# Patient Record
Sex: Male | Born: 1950 | Race: Black or African American | Hispanic: No | Marital: Single | Smoking: Current every day smoker
Health system: Southern US, Community
[De-identification: ages and names within clinical notes are randomized; demographics above are authoritative.]

## PROBLEM LIST (undated history)

## (undated) DIAGNOSIS — E669 Obesity, unspecified: Secondary | ICD-10-CM

## (undated) DIAGNOSIS — H409 Unspecified glaucoma: Secondary | ICD-10-CM

## (undated) DIAGNOSIS — E119 Type 2 diabetes mellitus without complications: Secondary | ICD-10-CM

## (undated) DIAGNOSIS — H543 Unqualified visual loss, both eyes: Secondary | ICD-10-CM

## (undated) HISTORY — PX: EYE SURGERY: SHX253

## (undated) HISTORY — PX: TONSILLECTOMY: SUR1361

---

## 2006-03-16 ENCOUNTER — Emergency Department: Payer: Self-pay | Admitting: Emergency Medicine

## 2007-03-30 ENCOUNTER — Ambulatory Visit: Payer: Self-pay | Admitting: Orthopaedic Surgery

## 2007-12-25 ENCOUNTER — Emergency Department: Payer: Self-pay | Admitting: Emergency Medicine

## 2010-03-10 ENCOUNTER — Ambulatory Visit: Payer: Self-pay | Admitting: Orthopedic Surgery

## 2011-01-14 ENCOUNTER — Emergency Department: Payer: Self-pay

## 2011-11-24 ENCOUNTER — Ambulatory Visit: Payer: Self-pay | Admitting: Ophthalmology

## 2014-12-08 NOTE — Op Note (Signed)
PATIENT NAME:  Roger Gilmore, [name] MR#:  [id number] DATE OF BIRTH:  06/11/1951  DATE OF PROCEDURE:  11/24/2011  PREOPERATIVE DIAGNOSIS: Uncontrolled glaucoma, left eye.   POSTOPERATIVE DIAGNOSIS: Uncontrolled glaucoma, left eye.   PROCEDURE: Trabeculectomy with mitomycin, left eye.   SURGEON: Deirdre Evenerhadwick R. Rett Stehlik, MD   ANESTHESIA: Retrobulbar block with Xylocaine, bupivacaine, and Vitrase administered under intravenous sedation.  ESTIMATED BLOOD LOSS: Less than 1 mL.  MITOMYCIN TIME: 2 minutes.   COMPLICATIONS: Removal of Express shunt because of inadequate fit beneath the scleral flap.    DESCRIPTION OF PROCEDURE: The patient was identified in the holding room and transported to the operative suite and placed in the supine position underneath the operating microscope. The left eye was identified as the operative eye and was prepped and draped in the usual sterile ophthalmic fashion after a retrobulbar block was administered under intravenous sedation.   A conjunctival peritomy was made from the 10 o'clock to 12 o'clock position. Hemostasis was achieved with wet-field cautery. Mitomycin-soaked pledgets were applied to the scleral bed for a period of two minutes. The concentration was 0.04% mitomycin. This area was then copiously irrigated with balanced salt solution. A 1 mm paracentesis was made at the 3 o'clock position. The anterior chamber was filled with Healon. A trapezoidal-shaped scleral flap was made at the 10:30 position. This was dissected forward partial-thickness up to the level of clear cornea. A 27-gauge needle was then used to enter the anterior chamber underneath the scleral flap. An Express shunt version P50, serial #[id number], was placed through this opening into the anterior chamber. It was centered without iris or corneal touch. However, upon placing the scleral flap back over the base of the Express shunt, the flange was not flat against the sclera. There was concern that there  could be eventual erosion of the overlying scleral flap without a flushed fit. Therefore, the Express shunt was removed. The surgery was converted to a trabeculectomy.   A Kelly punch was used to make a sclerotomy opening displaced nasally. A peripheral iridectomy was performed using Vannas scissors. Balanced salt solution was used to irrigate and to ensure hemostasis. Additional Healon was placed into the anterior chamber. The scleral flap was secured with two posterior 10-0 nylon sutures followed by four anterior sutures on the medial aspect. This was because there was significant flow anteriorly. There was one temporal suture placed on the side of the flap. This resulted in a total of seven 10-0 nylon sutures to secure the flap to restrict flow. The peritomy was then closed with running 9-0 Vicryl suture. The anterior chamber was filled with balanced salt solution and the eye was noted to have a low physiologic pressure of approximately 5 to 10 to palpation. There was no wound leak. Additional Healon was placed into the anterior chamber to ensure a stable chamber without hypotony in the early postoperative period. Wounds were checked and noted to be watertight. Topical Vigamox drops and Maxitrol ointment were applied to the eye. The eye was patched and shielded.   ____________________________ Deirdre Evenerhadwick R. Bray Vickerman, MD crb:drc D: 11/24/2011 15:21:03 ET T: 11/24/2011 17:41:46 ET JOB#: [id number]  cc: Deirdre Evenerhadwick R. Wendolyn Raso, MD, <Dictator> Lockie MolaHADWICK Renda Pohlman MD ELECTRONICALLY SIGNED 12/01/2011 12:27

## 2016-02-05 NOTE — Discharge Instructions (Signed)

## 2016-02-09 ENCOUNTER — Ambulatory Visit: Payer: Medicare Other | Admitting: Anesthesiology

## 2016-02-09 ENCOUNTER — Encounter: Payer: Self-pay | Admitting: *Deleted

## 2016-02-09 ENCOUNTER — Encounter
Admission: RE | Disposition: A | Discharge status: Home / Self Care | Payer: Self-pay | Source: Ambulatory Visit | Attending: Ophthalmology

## 2016-02-09 ENCOUNTER — Ambulatory Visit
Admission: RE | Admit: 2016-02-09 | Discharge: 2016-02-09 | Disposition: A | Discharge status: Home / Self Care | Payer: Medicare Other | Source: Ambulatory Visit | Attending: Ophthalmology | Admitting: Ophthalmology

## 2016-02-09 DIAGNOSIS — H401123 Primary open-angle glaucoma, left eye, severe stage: Secondary | ICD-10-CM | POA: Insufficient documentation

## 2016-02-09 DIAGNOSIS — F17210 Nicotine dependence, cigarettes, uncomplicated: Secondary | ICD-10-CM | POA: Insufficient documentation

## 2016-02-09 DIAGNOSIS — I10 Essential (primary) hypertension: Secondary | ICD-10-CM | POA: Diagnosis not present

## 2016-02-09 DIAGNOSIS — Z6841 Body Mass Index (BMI) 40.0 and over, adult: Secondary | ICD-10-CM | POA: Diagnosis not present

## 2016-02-09 HISTORY — DX: Type 2 diabetes mellitus without complications: E11.9

## 2016-02-09 HISTORY — DX: Obesity, unspecified: E66.9

## 2016-02-09 HISTORY — DX: Unspecified glaucoma: H40.9

## 2016-02-09 HISTORY — PX: PHOTOCOAGULATION WITH LASER: SHX6027

## 2016-02-09 LAB — GLUCOSE, CAPILLARY: Glucose-Capillary: 103 mg/dL — ABNORMAL HIGH (ref 65–99)

## 2016-02-09 SURGERY — PHOTOCOAGULATION, EYE, USING LASER
Anesthesia: Monitor Anesthesia Care | Laterality: Left | Wound class: Clean Contaminated

## 2016-02-09 MED ORDER — ALFENTANIL 500 MCG/ML IJ INJ
INJECTION | INTRAMUSCULAR | Status: DC | PRN
  Administered 2016-02-09: 1000 ug via INTRAVENOUS

## 2016-02-09 MED ORDER — MIDAZOLAM HCL 2 MG/2ML IJ SOLN
INTRAMUSCULAR | Status: DC | PRN
  Administered 2016-02-09: 2 mg via INTRAVENOUS

## 2016-02-09 MED ORDER — OXYCODONE HCL 5 MG PO TABS: 5.0000 mg | ORAL_TABLET | Freq: Once | ORAL | Status: DC | PRN

## 2016-02-09 MED ORDER — NEOMYCIN-POLYMYXIN-DEXAMETH 3.5-10000-0.1 OP OINT
TOPICAL_OINTMENT | OPHTHALMIC | Status: DC | PRN
  Administered 2016-02-09: 1 via OPHTHALMIC

## 2016-02-09 MED ORDER — LACTATED RINGERS IV SOLN: INTRAVENOUS | Status: DC

## 2016-02-09 MED ORDER — OXYCODONE HCL 5 MG/5ML PO SOLN: 5.0000 mg | Freq: Once | ORAL | Status: DC | PRN

## 2016-02-09 MED ORDER — LIDOCAINE HCL 2 % IJ SOLN
INTRAMUSCULAR | Status: DC | PRN
  Administered 2016-02-09: 5.5 mL via OPHTHALMIC

## 2016-02-09 SURGICAL SUPPLY — 11 items
BANDAGE EYE OVAL (MISCELLANEOUS) ×6 IMPLANT
DEVICE G-PROBE SGL USE (Laser) ×1 IMPLANT
DEVICE MICRO PULS P3 SGL USE (Laser) IMPLANT
G-PROBE SGL USE (Laser) ×3
GAUZE SPONGE 4X4 12PLY STRL (GAUZE/BANDAGES/DRESSINGS) ×3 IMPLANT
NDL RETROBULBAR .5 NSTRL (NEEDLE) ×3 IMPLANT
NEEDLE FILTER BLUNT 18X 1/2SAF (NEEDLE) ×2
NEEDLE FILTER BLUNT 18X1 1/2 (NEEDLE) ×1 IMPLANT
SYRINGE 10CC LL (SYRINGE) ×3 IMPLANT
WATER STERILE IRR 250ML POUR (IV SOLUTION) ×3 IMPLANT
WATER STERILE IRR 500ML POUR (IV SOLUTION) IMPLANT

## 2016-02-09 NOTE — Transfer of Care (Signed)
Immediate Anesthesia Transfer of Care Note  Patient: Roger Gilmore  Procedure(s) Performed: Procedure(s) with comments: PHOTOCOAGULATION WITH LASER (Left) - oral meds-prediabetes  Patient Location: PACU  Anesthesia Type: MAC  Level of Consciousness: awake, alert  and patient cooperative  Airway and Oxygen Therapy: Patient Spontanous Breathing and Patient connected to supplemental oxygen  Post-op Assessment: Post-op Vital signs reviewed, Patient's Cardiovascular Status Stable, Respiratory Function Stable, Patent Airway and No signs of Nausea or vomiting  Post-op Vital Signs: Reviewed and stable  Complications: No apparent anesthesia complications

## 2016-02-09 NOTE — H&P (Signed)
H+P reviewed and is up to date, please see paper chart.  

## 2016-02-09 NOTE — OR Nursing (Signed)
   A G-probe was applied to each the limbus with the following settings: , 2000 milliseconds. The eye was pressure patched closed.       Routing History     #18. The eye was pressure patched closed. The patient tolerated the procedure well and was transferred to the Post-operative Care Unit in stable condition.       Routing History

## 2016-02-09 NOTE — Anesthesia Preprocedure Evaluation (Signed)
Anesthesia Evaluation  Patient identified by MRN, date of birth, ID band  Reviewed: NPO status   History of Anesthesia Complications Negative for: history of anesthetic complications  Airway Mallampati: II  TM Distance: >3 FB Neck ROM: full    Dental no notable dental hx.    Pulmonary Current Smoker,    Pulmonary exam normal        Cardiovascular Exercise Tolerance: Good hypertension, Normal cardiovascular exam     Neuro/Psych Glaucoma  negative neurological ROS  negative psych ROS   GI/Hepatic negative GI ROS, Neg liver ROS,   Endo/Other  diabetesMorbid obesity (bmi=52)  Renal/GU negative Renal ROS  negative genitourinary   Musculoskeletal   Abdominal   Peds  Hematology negative hematology ROS (+)   Anesthesia Other Findings   Reproductive/Obstetrics                             Anesthesia Physical Anesthesia Plan  ASA: III  Anesthesia Plan: MAC   Post-op Pain Management:    Induction:   Airway Management Planned:   Additional Equipment:   Intra-op Plan:   Post-operative Plan:   Informed Consent: I have reviewed the patients History and Physical, chart, labs and discussed the procedure including the risks, benefits and alternatives for the proposed anesthesia with the patient or authorized representative who has indicated his/her understanding and acceptance.     Plan Discussed with: CRNA  Anesthesia Plan Comments:         Anesthesia Quick Evaluation

## 2016-02-09 NOTE — Anesthesia Postprocedure Evaluation (Signed)
Anesthesia Post Note  Patient: Roger PaulsJohn V Gilmore  Procedure(s) Performed: Procedure(s) (LRB): PHOTOCOAGULATION WITH LASER (Left)  Patient location during evaluation: PACU Anesthesia Type: MAC Level of consciousness: awake and alert Pain management: pain level controlled Vital Signs Assessment: post-procedure vital signs reviewed and stable Respiratory status: spontaneous breathing, nonlabored ventilation, respiratory function stable and patient connected to nasal cannula oxygen Cardiovascular status: stable and blood pressure returned to baseline Anesthetic complications: no    Rorey Hodges

## 2016-02-09 NOTE — Anesthesia Procedure Notes (Addendum)
Procedure Name: MAC Performed by: Julitza Rickles Pre-anesthesia Checklist: Patient identified, Emergency Drugs available, Suction available, Timeout performed and Patient being monitored Patient Re-evaluated:Patient Re-evaluated prior to inductionOxygen Delivery Method: Nasal cannula Placement Confirmation: positive ETCO2     

## 2016-02-09 NOTE — Op Note (Signed)
DATE OF SURGERY: 02/09/2016  PREOPERATIVE DIAGNOSES: Severe stage primary open angle glaucoma, left eye  POSTOPERATIVE DIAGNOSES: Same  PROCEDURES PERFORMED: Transscleral diode cyclophotocoagulation, left eye  SURGEON: Devin GoingAnita P. Vin, M.D.  ANESTHESIA: MAC, retrobulbar  COMPLICATIONS: None.  INDICATIONS FOR PROCEDURE: Mena PaulsJohn V Teal is a 65 y.o. year-old male with uncontrolled primary open angle glaucoma. The risks and benefits of glaucoma surgery were discussed with the patient, and he consented for a diode laser surgery.  PROCEDURE IN DETAIL: The eye for surgery was verified during the time-out procedure in the operating room. A retrobulbar block of lidocaine, Marcaine, and hyaluronidase was done for anesthesia. A G-probe was applied to each the limbus with the following settings: 2000mW, 2000 milliseconds, #18.  The eye was pressure patched closed. The patient tolerated the procedure well and was transferred to the Post-operative Care Unit in stable condition.

## 2016-02-10 ENCOUNTER — Encounter: Payer: Self-pay | Admitting: Ophthalmology

## 2016-02-12 ENCOUNTER — Encounter: Payer: Self-pay | Admitting: Ophthalmology

## 2016-11-19 DIAGNOSIS — N529 Male erectile dysfunction, unspecified: Secondary | ICD-10-CM | POA: Insufficient documentation

## 2017-01-24 ENCOUNTER — Ambulatory Visit (INDEPENDENT_AMBULATORY_CARE_PROVIDER_SITE_OTHER): Payer: Medicare Other | Admitting: Podiatry

## 2017-01-24 ENCOUNTER — Encounter: Payer: Self-pay | Admitting: Podiatry

## 2017-01-24 DIAGNOSIS — L03031 Cellulitis of right toe: Secondary | ICD-10-CM

## 2017-01-24 DIAGNOSIS — M79676 Pain in unspecified toe(s): Secondary | ICD-10-CM

## 2017-01-24 DIAGNOSIS — B351 Tinea unguium: Secondary | ICD-10-CM

## 2017-01-24 NOTE — Progress Notes (Signed)
Subjective:     Patient ID: Roger Gilmore, male   DOB: 09-10-1950, 66 y.o.   MRN: [id number]  HPI this patient presents the office with chief complaint of long thick painful nails both feet. He states the nails are painful walking and wearing his shoes. He also says that he kicked his nightstand about a week ago with his big toenail of the right foot and initially bled. It was painful, but since that time it has improved. He says the nail nail is unattached to the nailbed. He states that he is prediabetic and taking metformin. He presents the office today for an evaluation and treatment of his nails   Review of Systems     Objective:   Physical Exam GENERAL APPEARANCE: Alert, conversant. Appropriately groomed. No acute distress.  VASCULAR: Pedal pulses are  palpable at  [facility] bilateral.  Capillary refill time is immediate to all digits,  Normal temperature gradient.   NEUROLOGIC: sensation is normal to 5.07 monofilament at 5/5 sites bilateral.  Light touch is intact bilateral, Muscle strength normal.  MUSCULOSKELETAL: acceptable muscle strength, tone and stability bilateral.  Intrinsic muscluature intact bilateral.  Rectus appearance of foot and digits noted bilateral. HAV  B/L NAILS  Thick disfigured discolored nails both feet.  He has thickened unattached nail plate right hallux. DERMATOLOGIC: skin color, texture, and turgor are within normal limits.  No preulcerative lesions or ulcers  are seen, no interdigital maceration noted.  No open lesions present.  Digital nails are asymptomatic. No drainage noted.      Assessment:     Onychomycosis  B/L  Paronychia right hallux.    Plan:     IE  Debridement of nails  B/L.  Nail surgery right.  Treatment options and alternatives discussed.  Recommended permanent phenol matrixectomy and patient agreed.  Right hallux  was prepped with alcohol and a toe block of 3cc of 2% lidocaine plain was administered in a digital toe block. .  The toe was  then prepped with betadine solution .  The offending nail border was then excised and matrix tissue exposed.  Phenol was then applied to the matrix tissue followed by an alcohol wash.  Antibiotic ointment and a dry sterile dressing was applied.  The patient was dispensed instructions for aftercare. RTC 1 week.       Helane GuntherGregory Karman Biswell DPM

## 2017-01-31 ENCOUNTER — Ambulatory Visit (INDEPENDENT_AMBULATORY_CARE_PROVIDER_SITE_OTHER): Payer: Medicare Other | Admitting: Podiatry

## 2017-01-31 DIAGNOSIS — Z09 Encounter for follow-up examination after completed treatment for conditions other than malignant neoplasm: Secondary | ICD-10-CM

## 2017-01-31 NOTE — Progress Notes (Signed)
This patient returns to the office following nail surgery one week ago.  The patient says toe has been soaked and bandaged as directed.  There has been improvement of the toe since the surgery has been performed. The patient presents for continued evaluation and treatment. Patient has been air drying at home.    GENERAL APPEARANCE: Alert, conversant. Appropriately groomed. No acute distress.  VASCULAR: Pedal pulses palpable [facility]  [facility] bilateral.  Capillary refill time is immediate to all digits,  Normal temperature gradient.    NEUROLOGIC: sensation is normal to 5.07 monofilament at 5/5 sites bilateral.  Light touch is intact bilateral, Muscle strength normal.  MUSCULOSKELETAL: acceptable muscle strength, tone and stability bilateral.  Intrinsic muscluature intact bilateral.  Rectus appearance of foot and digits noted bilateral.   DERMATOLOGIC: skin color, texture, and turgor are within normal limits.  No preulcerative lesions or ulcers  are seen, no interdigital maceration noted.   NAILS  There is necrotic tissue along the nail groove  In the absence of redness swelling and pain. There is caking at the proximal nail fold.  DX  S/p nail surgery  ROV  Home instructions were discussed.  Patient to call the office if there are any questions or concerns. Freed the proximal nail fold to promote drainage.   Helane GuntherGregory Balen Woolum DPM

## 2017-08-21 ENCOUNTER — Other Ambulatory Visit: Payer: Self-pay

## 2017-08-21 ENCOUNTER — Emergency Department
Admission: EM | Admit: 2017-08-21 | Discharge: 2017-08-21 | Disposition: A | Discharge status: Home / Self Care | Payer: Medicare Other | Attending: Emergency Medicine | Admitting: Emergency Medicine

## 2017-08-21 ENCOUNTER — Encounter: Payer: Self-pay | Admitting: Emergency Medicine

## 2017-08-21 DIAGNOSIS — R739 Hyperglycemia, unspecified: Secondary | ICD-10-CM

## 2017-08-21 DIAGNOSIS — R42 Dizziness and giddiness: Secondary | ICD-10-CM | POA: Diagnosis present

## 2017-08-21 DIAGNOSIS — Z7982 Long term (current) use of aspirin: Secondary | ICD-10-CM | POA: Insufficient documentation

## 2017-08-21 DIAGNOSIS — F17210 Nicotine dependence, cigarettes, uncomplicated: Secondary | ICD-10-CM | POA: Insufficient documentation

## 2017-08-21 DIAGNOSIS — Z79899 Other long term (current) drug therapy: Secondary | ICD-10-CM | POA: Diagnosis not present

## 2017-08-21 DIAGNOSIS — Z7984 Long term (current) use of oral hypoglycemic drugs: Secondary | ICD-10-CM | POA: Diagnosis not present

## 2017-08-21 DIAGNOSIS — R55 Syncope and collapse: Secondary | ICD-10-CM | POA: Diagnosis not present

## 2017-08-21 DIAGNOSIS — E1165 Type 2 diabetes mellitus with hyperglycemia: Secondary | ICD-10-CM | POA: Insufficient documentation

## 2017-08-21 LAB — CBC
HEMATOCRIT: 46.7 % (ref 40.0–52.0)
HEMOGLOBIN: 15.6 g/dL (ref 13.0–18.0)
MCH: 28.4 pg (ref 26.0–34.0)
MCHC: 33.5 g/dL (ref 32.0–36.0)
MCV: 84.8 fL (ref 80.0–100.0)
Platelets: 163 10*3/uL (ref 150–440)
RBC: 5.51 MIL/uL (ref 4.40–5.90)
RDW: 13.2 % (ref 11.5–14.5)
WBC: 6.6 10*3/uL (ref 3.8–10.6)

## 2017-08-21 LAB — GLUCOSE, CAPILLARY
GLUCOSE-CAPILLARY: 310 mg/dL — AB (ref 65–99)
GLUCOSE-CAPILLARY: 232 mg/dL — AB (ref 65–99)

## 2017-08-21 LAB — BASIC METABOLIC PANEL
ANION GAP: 11 (ref 5–15)
BUN: 14 mg/dL (ref 6–20)
CO2: 21 mmol/L — AB (ref 22–32)
Calcium: 8.9 mg/dL (ref 8.9–10.3)
Chloride: 103 mmol/L (ref 101–111)
Creatinine, Ser: 1.08 mg/dL (ref 0.61–1.24)
GFR calc Af Amer: >60 mL/min (ref >60)
GLUCOSE: 336 mg/dL — AB (ref 65–99)
POTASSIUM: 4.1 mmol/L (ref 3.5–5.1)
Sodium: 135 mmol/L (ref 135–145)

## 2017-08-21 LAB — URINALYSIS, COMPLETE (UACMP) WITH MICROSCOPIC
BACTERIA UA: NONE SEEN
Bilirubin Urine: NEGATIVE
Glucose, UA: >=500 mg/dL — AB
Hgb urine dipstick: NEGATIVE
Ketones, ur: NEGATIVE mg/dL
Leukocytes, UA: NEGATIVE
Nitrite: NEGATIVE
PH: 5 (ref 5.0–8.0)
Protein, ur: NEGATIVE mg/dL
RBC / HPF: NONE SEEN RBC/hpf (ref 0–5)
SPECIFIC GRAVITY, URINE: 1.027 (ref 1.005–1.030)

## 2017-08-21 MED ORDER — METFORMIN HCL 850 MG PO TABS: 850.0000 mg | ORAL_TABLET | Freq: Two times a day (BID) | ORAL | 1 refills | Status: DC

## 2017-08-21 MED ORDER — SODIUM CHLORIDE 0.9 % IV BOLUS (SEPSIS)
1000.0000 mL | Freq: Once | INTRAVENOUS | Status: AC
  Administered 2017-08-21: 1000 mL via INTRAVENOUS

## 2017-08-21 NOTE — ED Provider Notes (Signed)
[facility] Emergency Department Provider Note ____________________________________________   First MD Initiated Contact with Patient 08/21/17 1620     (approximate)  I have reviewed the triage vital signs and the nursing notes.   HISTORY  Chief Complaint Dizziness    HPI Roger Gilmore is a 67 y.o. male with past medical history as noted below including diabetes on metformin who presents with an acute episode of dizziness, occurring while he was speaking to his brother but otherwise not exerting himself, described as lightheadedness, and associated with becoming sweaty and feeling like he was about to pass out.  The patient states that he now feels better.  He also reports over the last week feeling dry mouth, thirsty all the time, and urinating more frequently.  He denies shortness of breath, fever, vomiting or diarrhea, pain anywhere, or any other recent illness.  The patient states that he is compliant on his metformin and has been on the same dose for some time.   Past Medical History:  Diagnosis Date  . Diabetes mellitus without complication (HCC)    pre-diabetes on metformin  . Glaucoma   . Obesity     There are no active problems to display for this patient.   Past Surgical History:  Procedure Laterality Date  . EYE SURGERY    . PHOTOCOAGULATION WITH LASER Left 02/09/2016   Procedure: PHOTOCOAGULATION WITH LASER;  Surgeon: Sherald Hess, MD;  Location: [facility];  Service: Ophthalmology;  Laterality: Left;  oral meds-prediabetes  . TONSILLECTOMY      Prior to Admission medications   Medication Sig Start Date End Date Taking? Authorizing Provider  aspirin 81 MG chewable tablet Chew 81 mg by mouth daily. am    [provider]  brimonidine (ALPHAGAN P) 0.1 % SOLN Breakfast and dinner    [provider]  dorzolamide-timolol (COSOPT) 22.3-6.8 MG/ML ophthalmic solution 1 drop 2 (two) times daily. Breakfast  and dinner    [provider]  latanoprost (XALATAN) 0.005 % ophthalmic solution 1 drop at bedtime.    [provider]  losartan (COZAAR) 25 MG tablet Take 25 mg by mouth daily. am    [provider]  metFORMIN (GLUCOPHAGE) 850 MG tablet Take 1 tablet (850 mg total) by mouth 2 (two) times daily with a meal. 08/21/17 10/20/17  Dionne Bucy, MD  sildenafil (REVATIO) 20 MG tablet Take by mouth. 11/19/16 11/19/17  [provider]    Allergies Patient has no known allergies.  No family history on file.  Social History Social History   Tobacco Use  . Smoking status: Current Every Day Smoker    Packs/day: 0.25    Years: 45.00    Pack years: 11.25    Types: Cigarettes  . Smokeless tobacco: Never Used  Substance Use Topics  . Alcohol use: Yes    Comment: occasional  . Drug use: No    Review of Systems  Constitutional: No fever. Eyes: No redness. ENT: No sore throat. Cardiovascular: Denies chest pain. Respiratory: Denies shortness of breath. Gastrointestinal: No nausea, no vomiting.  No diarrhea.  Genitourinary: Positive for frequency.  Musculoskeletal: Negative for back pain. Skin: Negative for rash. Neurological: Negative for headache.   ____________________________________________   PHYSICAL EXAM:  VITAL SIGNS: ED Triage Vitals  Enc Vitals Group     BP 08/21/17 1448 123/68     Pulse Rate 08/21/17 1448 75     Resp 08/21/17 1448 16     Temp 08/21/17 1448  98.5 F (36.9 C)     Temp Source 08/21/17 1448 Oral     SpO2 08/21/17 1448 97 %     Weight 08/21/17 1540 (!) 320 lb (145.2 kg)     Height 08/21/17 1448 5\' 6"  (1.676 m)     Head Circumference --      Peak Flow --      Pain Score --      Pain Loc --      Pain Edu? --      Excl. in GC? --     Constitutional: Alert and oriented. Well appearing and in no acute distress. Eyes: Conjunctivae are normal.  Head: Atraumatic. Nose: No congestion/rhinnorhea. Mouth/Throat: Mucous  membranes are dry.   Neck: Normal range of motion.  Cardiovascular: Normal rate, regular rhythm. Grossly normal heart sounds.  Good peripheral circulation. Respiratory: Normal respiratory effort.  No retractions. Lungs CTAB. Gastrointestinal: Soft and nontender. No distention.  Genitourinary: No flank tenderness. Musculoskeletal: No lower extremity edema.  Extremities warm and well perfused.  Neurologic:  Normal speech and language. No gross focal neurologic deficits are appreciated.  Skin:  Skin is warm and dry. No rash noted. Psychiatric: Mood and affect are normal. Speech and behavior are normal.  ____________________________________________   LABS (all labs ordered are listed, but only abnormal results are displayed)  Labs Reviewed  BASIC METABOLIC PANEL - Abnormal; Notable for the following components:      Result Value   CO2 21 (*)    Glucose, Bld 336 (*)    All other components within normal limits  URINALYSIS, COMPLETE (UACMP) WITH MICROSCOPIC - Abnormal; Notable for the following components:   Color, Urine YELLOW (*)    APPearance CLEAR (*)    Glucose, UA >=500 (*)    Squamous Epithelial / LPF 0-5 (*)    All other components within normal limits  GLUCOSE, CAPILLARY - Abnormal; Notable for the following components:   Glucose-Capillary 310 (*)    All other components within normal limits  CBC  CBG MONITORING, ED  CBG MONITORING, ED   ____________________________________________  EKG  ED ECG REPORT I, Dionne Bucy, the attending physician, personally viewed and interpreted this ECG.  Date: 08/21/2017 EKG Time: 1459 Rate: 72 Rhythm: normal sinus rhythm QRS Axis: normal Intervals: Left anterior fascicular block ST/T Wave abnormalities: LVH Narrative Interpretation: no evidence of acute ischemia; no recent prior EKG available for  comparison  ____________________________________________  RADIOLOGY    ____________________________________________   PROCEDURES  Procedure(s) performed: No    Critical Care performed: No ____________________________________________   INITIAL IMPRESSION / ASSESSMENT AND PLAN / ED COURSE  Pertinent labs & imaging results that were available during my care of the patient were reviewed by me and considered in my medical decision making (see chart for details).  67 year old male with past medical history as noted above presents with dizziness and near syncope this afternoon, associated with increased thirst and urination over the last week.  He states that he is on metformin but does not check his glucose regularly.  He states he is compliant.  Past medical records reviewed in Epic and are noncontributory.  On exam, the patient is extremely well-appearing, the vital signs are normal, and the remainder the exam is unremarkable.  There is no peripheral edema, no abnormal lung sounds, and the neuro exam is nonfocal.  He does appear slightly dehydrated.  Presentation overall is most consistent with vasovagal syncope possibly precipitated by dehydration.  There is no evidence of cardiac cause.  Initial labs reveal elevated glucose and the patient's other symptoms are consistent with hyperglycemia.  There is no clinical evidence for DKA or lab findings suggesting it.  Plan: IV fluids and recheck glucose.  I will likely increase the patient's metformin dose given that he is compliant but the glucose has likely been elevated for some time.  Anticipate discharge with close outpatient follow-up.  ----------------------------------------- 7:24 PM on 08/21/2017 -----------------------------------------  Repeat fingerstick is improved.  Patient states he feels much better.  The remainder the lab workup is unremarkable.  I will switch the patient to 850 mg twice daily of metformin and he was  instructed to follow-up with his primary care doctor.  Return precautions given and the patient and his family members expressed understanding. ____________________________________________   FINAL CLINICAL IMPRESSION(S) / ED DIAGNOSES  Final diagnoses:  Near syncope  Hyperglycemia      NEW MEDICATIONS STARTED DURING THIS VISIT:  This SmartLink is deprecated. Use AVSMEDLIST instead to display the medication list for a patient.   Note:  This document was prepared using Dragon voice recognition software and may include unintentional dictation errors.    Dionne BucySiadecki, Ronal Maybury, MD 08/21/17 1924

## 2017-08-21 NOTE — ED Notes (Signed)
FIRST NURSE NOTE:  [facility]

## 2017-08-21 NOTE — ED Notes (Signed)
Pt stood at bedside to void denies any dizziness UA collected sent to lab

## 2017-08-21 NOTE — ED Triage Notes (Signed)
[facility] Pt states that he has been feeling dizzy and sweating a lot. Pt has hx/o DM, states that he takes metformin. Pt states that he has not been eating well the past few days, reports increased thirst. Pt states that he has had decreased energy. Pt denies chest pain.

## 2017-08-21 NOTE — ED Notes (Signed)
Pt resting no distress noted no needs at present discussed plan of care will check CBG after fluids are complete infused

## 2017-08-21 NOTE — ED Notes (Signed)
fsbs 232   md aware.

## 2017-08-21 NOTE — Discharge Instructions (Signed)
You should start the higher dose of the metformin as soon as you are able to get it.  You may take the 500 mg as prescribed tonight and tomorrow morning until you obtain the higher dose.  Return to the emergency department immediately for new or worsening dizziness, weakness, lightheadedness, feeling like you are going to pass out, or persistently high sugar readings.  Follow-up with your primary care doctor within the next 1-2 weeks.

## 2018-03-16 ENCOUNTER — Ambulatory Visit (INDEPENDENT_AMBULATORY_CARE_PROVIDER_SITE_OTHER): Payer: Medicare Other | Admitting: Podiatry

## 2018-03-16 ENCOUNTER — Encounter: Payer: Self-pay | Admitting: Podiatry

## 2018-03-16 DIAGNOSIS — T148XXA Other injury of unspecified body region, initial encounter: Secondary | ICD-10-CM | POA: Diagnosis not present

## 2018-03-16 DIAGNOSIS — W450XXA Nail entering through skin, initial encounter: Secondary | ICD-10-CM

## 2018-03-16 NOTE — Progress Notes (Signed)
This patient presents the office with chief complaint of an injury to the left big toenail.  He says he stubbed his nail on a step  causing significant pain and discomfort to his left great toe.  He says he is experiencing pain and discomfort walking and wearing his shoe. He says that the toenail has been bleeding since the injury.  This patient is blind.  He presents the office today for an evaluation and treatment of this painful unattached nail plate left foot.  General Appearance  Alert, conversant and in no acute stress.  Vascular  Dorsalis pedis and posterior tibial  pulses are palpable  bilaterally.  Capillary return is within normal limits  bilaterally. Temperature is within normal limits  bilaterally.  Neurologic  Senn-Weinstein monofilament wire test within normal limits  bilaterally. Muscle power within normal limits bilaterally.  Nails Thick disfigured discolored nail plate rleft hallux which is minimally unattached from the nail bed. Right hallux toenail was previously removed.  Orthopedic  No limitations of motion of motion feet .  No crepitus or effusions noted.  No bony pathology or digital deformities noted.  Skin  normotropic skin with no porokeratosis noted bilaterally.  No signs of infections or ulcers noted.    Subungual hematoma  Nail injury left hallux.  ROV.Marland Kitchen.  Under local anesthesia of 4 mL of 2% plain, the nail plate was removed from the nailbed.  Surgical site was  bandaged with neossporin and a dry sterile dressing.  Home instructions given.  Patient to return to the office in 10 days for further evaluation and treatment.   Helane GuntherGregory Kory Panjwani DPM

## 2018-03-27 ENCOUNTER — Ambulatory Visit (INDEPENDENT_AMBULATORY_CARE_PROVIDER_SITE_OTHER): Payer: Medicare Other | Admitting: Podiatry

## 2018-03-27 ENCOUNTER — Encounter: Payer: Self-pay | Admitting: Podiatry

## 2018-03-27 DIAGNOSIS — Z09 Encounter for follow-up examination after completed treatment for conditions other than malignant neoplasm: Secondary | ICD-10-CM

## 2018-03-27 DIAGNOSIS — B351 Tinea unguium: Secondary | ICD-10-CM | POA: Diagnosis not present

## 2018-03-27 DIAGNOSIS — E119 Type 2 diabetes mellitus without complications: Secondary | ICD-10-CM

## 2018-03-27 DIAGNOSIS — M79676 Pain in unspecified toe(s): Secondary | ICD-10-CM | POA: Diagnosis not present

## 2018-03-27 NOTE — Progress Notes (Signed)
This patient presents the office for a follow-up on an nail injury, left foot.  He says that he has been soaking and bandaging his toe and he is doing well.  He also says that his nails are long thick and painful.  He says his nails are painful walking and wearing his shoes, especially his second toe right foot, which is ingrown.  He presents the office for  preventative foot care services.  General Appearance  Alert, conversant and in no acute stress.  Vascular  Dorsalis pedis and posterior tibial  pulses are palpable  bilaterally.  Capillary return is within normal limits  bilaterally. Temperature is within normal limits  bilaterally.  Neurologic  Senn-Weinstein monofilament wire test within normal limits  bilaterally. Muscle power within normal limits bilaterally.  Nails Thick disfigured discolored nail plate 2-5  B/L.  Healing left hallux.  No infection or drainage.  Orthopedic  No limitations of motion of motion feet .  No crepitus or effusions noted.  No bony pathology or digital deformities noted.  Skin  normotropic skin with no porokeratosis noted bilaterally.  No signs of infections or ulcers noted.     S/P nail surgery.  Onychomycosis  B/l  ROV.Marland Kitchen.  Healing noted at nail bed left foot.  Debride onychomycosis  X 8.  RTC prn.   Helane GuntherGregory Adley Mazurowski DPM

## 2019-11-29 ENCOUNTER — Ambulatory Visit: Payer: Medicare Other | Admitting: Podiatry

## 2019-11-29 ENCOUNTER — Other Ambulatory Visit: Payer: Self-pay

## 2019-11-29 ENCOUNTER — Encounter: Payer: Self-pay | Admitting: Podiatry

## 2019-11-29 ENCOUNTER — Encounter: Payer: Self-pay | Admitting: *Deleted

## 2019-11-29 VITALS — Temp 96.5°F

## 2019-11-29 DIAGNOSIS — B351 Tinea unguium: Secondary | ICD-10-CM

## 2019-11-29 DIAGNOSIS — I10 Essential (primary) hypertension: Secondary | ICD-10-CM | POA: Insufficient documentation

## 2019-11-29 DIAGNOSIS — H547 Unspecified visual loss: Secondary | ICD-10-CM | POA: Insufficient documentation

## 2019-11-29 DIAGNOSIS — M79676 Pain in unspecified toe(s): Secondary | ICD-10-CM | POA: Diagnosis not present

## 2019-11-29 DIAGNOSIS — E119 Type 2 diabetes mellitus without complications: Secondary | ICD-10-CM

## 2019-11-29 NOTE — Progress Notes (Signed)
This patient returns to my office for at risk foot care.  This patient requires this care by a professional since this patient will be at risk due to having diabetes and blindness.Roger Gilmore  He is accompanied by his wife.  He has had surgery for permanent removal of the right nail plate years ago.  He has not been seen in this office for over 2 years.  This patient is unable to cut nails himself since the patient cannot reach his nails.These nails are painful walking and wearing shoes.  This patient presents for at risk foot care today. He inquires about permanent nail removal left big toenail.    General Appearance  Alert, conversant and in no acute stress.  Vascular  Dorsalis pedis and posterior tibial  pulses are palpable  bilaterally.  Capillary return is within normal limits  bilaterally. Temperature is within normal limits  bilaterally.  Neurologic  Senn-Weinstein monofilament wire test within normal limits  bilaterally. Muscle power within normal limits bilaterally.  Nails Thick disfigured discolored nails with subungual debris  from hallux to fifth toes bilaterally with the exception right hallux nail plate.   No evidence of bacterial infection or drainage bilaterally.  Orthopedic  No limitations of motion  feet .  No crepitus or effusions noted.  No bony pathology or digital deformities noted.  Skin  normotropic skin with no porokeratosis noted bilaterally.  No signs of infections or ulcers noted.     Onychomycosis  Pain in right toes  Pain in left toes  Consent was obtained for treatment procedures.   Mechanical debridement of nails 1-5  bilaterally performed with a nail nipper.  Filed with dremel without incident.  Discussed this condition with this patient.  Told him that he is a candidate for nail surgery but his sugar which was taken last week was 200.  Therefore I told this patient that he needs to work on his sugar and get an okay from his medical doctor and I will perform the permanent nail  surgery for the removal of the left great toenail.  Told this patient to return to the office in 3 months and we will discuss surgery then.      Return office visit  3 months                    Told patient to return for periodic foot care and evaluation due to potential at risk complications.   Helane Gunther DPM

## 2020-02-28 ENCOUNTER — Ambulatory Visit: Payer: Medicare Other | Admitting: Podiatry

## 2020-07-10 ENCOUNTER — Inpatient Hospital Stay: Payer: Medicare Other

## 2020-07-10 ENCOUNTER — Observation Stay
Admission: EM | Admit: 2020-07-10 | Discharge: 2020-07-11 | Disposition: A | Discharge status: Home / Self Care | Payer: Medicare Other | Attending: Internal Medicine | Admitting: Internal Medicine

## 2020-07-10 ENCOUNTER — Emergency Department: Payer: Medicare Other

## 2020-07-10 ENCOUNTER — Encounter: Payer: Self-pay | Admitting: Internal Medicine

## 2020-07-10 ENCOUNTER — Inpatient Hospital Stay
Admit: 2020-07-10 | Discharge: 2020-07-10 | Disposition: A | Discharge status: Home / Self Care | Payer: Medicare Other | Attending: Internal Medicine | Admitting: Internal Medicine

## 2020-07-10 ENCOUNTER — Other Ambulatory Visit: Payer: Self-pay

## 2020-07-10 DIAGNOSIS — E119 Type 2 diabetes mellitus without complications: Secondary | ICD-10-CM

## 2020-07-10 DIAGNOSIS — Z79899 Other long term (current) drug therapy: Secondary | ICD-10-CM | POA: Insufficient documentation

## 2020-07-10 DIAGNOSIS — R29898 Other symptoms and signs involving the musculoskeletal system: Secondary | ICD-10-CM

## 2020-07-10 DIAGNOSIS — I10 Essential (primary) hypertension: Secondary | ICD-10-CM | POA: Diagnosis present

## 2020-07-10 DIAGNOSIS — F17210 Nicotine dependence, cigarettes, uncomplicated: Secondary | ICD-10-CM | POA: Insufficient documentation

## 2020-07-10 DIAGNOSIS — Z6841 Body Mass Index (BMI) 40.0 and over, adult: Secondary | ICD-10-CM

## 2020-07-10 DIAGNOSIS — Z7982 Long term (current) use of aspirin: Secondary | ICD-10-CM | POA: Insufficient documentation

## 2020-07-10 DIAGNOSIS — Z20822 Contact with and (suspected) exposure to covid-19: Secondary | ICD-10-CM | POA: Diagnosis not present

## 2020-07-10 DIAGNOSIS — H547 Unspecified visual loss: Secondary | ICD-10-CM

## 2020-07-10 DIAGNOSIS — I639 Cerebral infarction, unspecified: Secondary | ICD-10-CM

## 2020-07-10 DIAGNOSIS — Z7984 Long term (current) use of oral hypoglycemic drugs: Secondary | ICD-10-CM | POA: Insufficient documentation

## 2020-07-10 DIAGNOSIS — R531 Weakness: Secondary | ICD-10-CM | POA: Diagnosis present

## 2020-07-10 HISTORY — DX: Unqualified visual loss, both eyes: H54.3

## 2020-07-10 LAB — ECHOCARDIOGRAM COMPLETE
AR max vel: 3.68 cm2
AV Area VTI: 2.9 cm2
AV Area mean vel: 3.5 cm2
AV Mean grad: 1 mmHg
AV Peak grad: 2.4 mmHg
Ao pk vel: 0.77 m/s
Area-P 1/2: 3.1 cm2
Height: 66 in
S' Lateral: 3.12 cm
Weight: 4992 oz

## 2020-07-10 LAB — COMPREHENSIVE METABOLIC PANEL
ALT: 17 U/L (ref 0–44)
AST: 16 U/L (ref 15–41)
Albumin: 3.7 g/dL (ref 3.5–5.0)
Alkaline Phosphatase: 53 U/L (ref 38–126)
Anion gap: 12 (ref 5–15)
BUN: 15 mg/dL (ref 8–23)
CO2: 23 mmol/L (ref 22–32)
Calcium: 8.8 mg/dL — ABNORMAL LOW (ref 8.9–10.3)
Chloride: 104 mmol/L (ref 98–111)
Creatinine, Ser: 1.31 mg/dL — ABNORMAL HIGH (ref 0.61–1.24)
GFR, Estimated: 59 mL/min — ABNORMAL LOW (ref >60)
Glucose, Bld: 109 mg/dL — ABNORMAL HIGH (ref 70–99)
Potassium: 3.6 mmol/L (ref 3.5–5.1)
Sodium: 139 mmol/L (ref 135–145)
Total Bilirubin: 0.7 mg/dL (ref 0.3–1.2)
Total Protein: 6.8 g/dL (ref 6.5–8.1)

## 2020-07-10 LAB — PROTIME-INR
INR: 1 (ref 0.8–1.2)
Prothrombin Time: 13.2 seconds (ref 11.4–15.2)

## 2020-07-10 LAB — DIFFERENTIAL
Abs Immature Granulocytes: 0.01 10*3/uL (ref 0.00–0.07)
Basophils Absolute: 0.1 10*3/uL (ref 0.0–0.1)
Basophils Relative: 1 %
Eosinophils Absolute: 0.4 10*3/uL (ref 0.0–0.5)
Eosinophils Relative: 7 %
Immature Granulocytes: 0 %
Lymphocytes Relative: 38 %
Lymphs Abs: 2.1 10*3/uL (ref 0.7–4.0)
Monocytes Absolute: 0.5 10*3/uL (ref 0.1–1.0)
Monocytes Relative: 10 %
Neutro Abs: 2.4 10*3/uL (ref 1.7–7.7)
Neutrophils Relative %: 44 %

## 2020-07-10 LAB — GLUCOSE, CAPILLARY
Glucose-Capillary: 173 mg/dL — ABNORMAL HIGH (ref 70–99)
Glucose-Capillary: 147 mg/dL — ABNORMAL HIGH (ref 70–99)
Glucose-Capillary: 169 mg/dL — ABNORMAL HIGH (ref 70–99)

## 2020-07-10 LAB — CBG MONITORING, ED: Glucose-Capillary: 107 mg/dL — ABNORMAL HIGH (ref 70–99)

## 2020-07-10 LAB — LIPID PANEL
Cholesterol: 167 mg/dL (ref 0–200)
HDL: 45 mg/dL (ref >40)
LDL Cholesterol: 100 mg/dL — ABNORMAL HIGH (ref 0–99)
Total CHOL/HDL Ratio: 3.7 RATIO
Triglycerides: 111 mg/dL (ref <150)
VLDL: 22 mg/dL (ref 0–40)

## 2020-07-10 LAB — RESP PANEL BY RT-PCR (FLU A&B, COVID) ARPGX2
Influenza A by PCR: NEGATIVE
Influenza B by PCR: NEGATIVE
SARS Coronavirus 2 by RT PCR: NEGATIVE

## 2020-07-10 LAB — CBC
HCT: 43.3 % (ref 39.0–52.0)
Hemoglobin: 14.2 g/dL (ref 13.0–17.0)
MCH: 28.5 pg (ref 26.0–34.0)
MCHC: 32.8 g/dL (ref 30.0–36.0)
MCV: 86.9 fL (ref 80.0–100.0)
Platelets: 193 10*3/uL (ref 150–400)
RBC: 4.98 MIL/uL (ref 4.22–5.81)
RDW: 12.7 % (ref 11.5–15.5)
WBC: 5.5 10*3/uL (ref 4.0–10.5)
nRBC: 0 % (ref 0.0–0.2)

## 2020-07-10 LAB — APTT: aPTT: 28 seconds (ref 24–36)

## 2020-07-10 LAB — HIV ANTIBODY (ROUTINE TESTING W REFLEX): HIV Screen 4th Generation wRfx: NONREACTIVE

## 2020-07-10 LAB — ETHANOL: Alcohol, Ethyl (B): <10 mg/dL (ref <10)

## 2020-07-10 LAB — HEMOGLOBIN A1C
Hgb A1c MFr Bld: 7.2 % — ABNORMAL HIGH (ref 4.8–5.6)
Mean Plasma Glucose: 159.94 mg/dL

## 2020-07-10 MED ORDER — INSULIN ASPART 100 UNIT/ML ~~LOC~~ SOLN: 0.0000 [IU] | Freq: Every day | SUBCUTANEOUS | Status: DC

## 2020-07-10 MED ORDER — ACETAMINOPHEN 650 MG RE SUPP: 650.0000 mg | RECTAL | Status: DC | PRN

## 2020-07-10 MED ORDER — ATORVASTATIN CALCIUM 20 MG PO TABS
40.0000 mg | ORAL_TABLET | Freq: Every day | ORAL | Status: DC
  Administered 2020-07-10: 40 mg via ORAL
  Filled 2020-07-10: qty 2

## 2020-07-10 MED ORDER — ACETAMINOPHEN 160 MG/5ML PO SOLN
650.0000 mg | ORAL | Status: DC | PRN
  Filled 2020-07-10: qty 20.3

## 2020-07-10 MED ORDER — CLOPIDOGREL BISULFATE 75 MG PO TABS
75.0000 mg | ORAL_TABLET | Freq: Every day | ORAL | Status: DC
  Administered 2020-07-10 – 2020-07-11 (×2): 75 mg via ORAL
  Filled 2020-07-10 (×2): qty 1

## 2020-07-10 MED ORDER — ASPIRIN 300 MG RE SUPP: 300.0000 mg | Freq: Every day | RECTAL | Status: DC

## 2020-07-10 MED ORDER — STROKE: EARLY STAGES OF RECOVERY BOOK: Freq: Once | Status: DC

## 2020-07-10 MED ORDER — ASPIRIN EC 81 MG PO TBEC: 81.0000 mg | DELAYED_RELEASE_TABLET | Freq: Every day | ORAL | Status: DC

## 2020-07-10 MED ORDER — ENOXAPARIN SODIUM 40 MG/0.4ML ~~LOC~~ SOLN
40.0000 mg | SUBCUTANEOUS | Status: DC
  Administered 2020-07-10: 40 mg via SUBCUTANEOUS
  Filled 2020-07-10: qty 0.4

## 2020-07-10 MED ORDER — INSULIN ASPART 100 UNIT/ML ~~LOC~~ SOLN
0.0000 [IU] | Freq: Three times a day (TID) | SUBCUTANEOUS | Status: DC
  Administered 2020-07-10: 3 [IU] via SUBCUTANEOUS
  Administered 2020-07-10: 2 [IU] via SUBCUTANEOUS
  Administered 2020-07-11: 3 [IU] via SUBCUTANEOUS
  Administered 2020-07-11: 2 [IU] via SUBCUTANEOUS
  Filled 2020-07-10 (×3): qty 1

## 2020-07-10 MED ORDER — PERFLUTREN LIPID MICROSPHERE
1.0000 mL | INTRAVENOUS | Status: AC | PRN
  Administered 2020-07-10: 2 mL via INTRAVENOUS
  Filled 2020-07-10: qty 10

## 2020-07-10 MED ORDER — SODIUM CHLORIDE 0.9 % IV BOLUS
1000.0000 mL | Freq: Once | INTRAVENOUS | Status: AC
  Administered 2020-07-10: 1000 mL via INTRAVENOUS

## 2020-07-10 MED ORDER — SODIUM CHLORIDE 0.9 % IV SOLN: INTRAVENOUS | Status: DC

## 2020-07-10 MED ORDER — ASPIRIN EC 325 MG PO TBEC
325.0000 mg | DELAYED_RELEASE_TABLET | Freq: Every day | ORAL | Status: DC
  Administered 2020-07-10 – 2020-07-11 (×2): 325 mg via ORAL
  Filled 2020-07-10 (×2): qty 1

## 2020-07-10 MED ORDER — ACETAMINOPHEN 325 MG PO TABS: 650.0000 mg | ORAL_TABLET | ORAL | Status: DC | PRN

## 2020-07-10 NOTE — Progress Notes (Signed)
PROGRESS NOTE    Roger Gilmore  GYJ:[id number] DOB: 02/10/1951 DOA: 07/10/2020 PCP: Gracelyn Nurse, MD    Brief Narrative:  69 y.o. male with medical history significant for DM, HTN, glaucoma related blindness, morbid obesity who presents to the emergency room with left-sided weakness.  Patient stated that he noticed weakness in his left upper extremity while he was looking at TV that persisted when he went to bed at around 2100.  At around midnight, when he awoke to go to the bathroom he felt like his left leg was heavy and noticed weakness in his left arm.  He had no weakness in the face, slurring of the speech, and had no headache, nausea or vomiting his girlfriend called EMS.  She states that he was unable to stand up without assistance and she helped him get dressed ED Course: Patient arrived in the ER at 2 AM and does not appear to have arrived as code stroke.  On arrival in the emergency room, patient awake and alert, still weak on left side but stating symptoms.  NIHSS reported by the ER of 2.  BP 130/78.  Vitals otherwise within normal limits.  Blood work unremarkable except for creatinine of 1.31 but no recent baseline available CT head shows no acute intracranial findings. Pt was admitted for stroke work up  Assessment & Plan:   Principal Problem:   Acute CVA (cerebrovascular accident) (HCC) Active Problems:   Blindness   DM type 2 (diabetes mellitus, type 2) (HCC)   Hypertension   Morbid obesity with BMI of 50.0-59.9, adult (HCC)    Acute CVA (cerebrovascular accident) (HCC) -Patient presented with left hemiparesis, presenting outside TPA window -Head CT negative -Follow-up MRI/MRA reviewed confirmed multifocal acute R frontal infarcts -Carotid dopplers reveal <50 stenosis involving R ICA -2d echo performed, pending results -Neurology consulted, recommending dual antiplatelet tx -PT/OT consulted -A1c reviewed, noted to be 7.2    DM type 2 (diabetes mellitus, type 2)  (HCC) -Continue with sliding scale insulin -Glucose trends stable    Hypertension -Holding home meds for permissive hypertension -BP stable at present    Morbid obesity with BMI of 50.0-59.9, adult (HCC) -Recommend diet/lifestyle modification  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   Consultants:   Neurology  Procedures:     Antimicrobials: Anti-infectives (From admission, onward)   None       Subjective: This AM, still complaining of L sided weakness. Weakness in L hand seems improving  Objective: Vitals:   07/10/20 0800 07/10/20 0830 07/10/20 0959 07/10/20 1030  BP: (!) 130/93 140/81 (!) 144/77   Pulse: (!) 52 (!) 55 (!) 56   Resp: 14 15 18    Temp:   98 F (36.7 C)   TempSrc:      SpO2: 99% 100% 99%   Weight:    )  141.5 kg  Height:    5\' 6"  (1.676 m)    Intake/Output Summary (Last 24 hours) at 07/10/2020 1414 Last data filed at 07/10/2020 1330 Gross per 24 hour  Intake 1240 ml  Output --  Net 1240 ml   Filed Weights   07/10/20 1030  Weight: (!) 141.5 kg    Examination:  General exam: Appears calm and comfortable  Respiratory system: Clear to auscultation. Respiratory effort normal. Cardiovascular system: S1 & S2 heard, Regular Gastrointestinal system: Abdomen is nondistended, soft and nontender. No organomegaly or masses felt. Normal bowel sounds heard. Central nervous system: Alert and oriented. No focal  neurological deficits. Extremities: 4+/5 strength in LLE and LUE Skin: No rashes, lesions  Psychiatry: Judgement and insight appear normal. Mood & affect appropriate.   Data Reviewed: I have personally reviewed following labs and imaging studies  CBC: Recent Labs  Lab 07/10/20 0210  WBC 5.5  NEUTROABS 2.4  HGB 14.2  HCT 43.3  MCV 86.9  PLT 193   Basic Metabolic Panel: Recent Labs  Lab 07/10/20 0210  NA 139  K 3.6  CL 104  CO2 23  GLUCOSE 109*  BUN 15  CREATININE 1.31*  CALCIUM 8.8*   GFR: Estimated Creatinine Clearance: 72.4 mL/min (A) (by C-G formula based on SCr of 1.31 mg/dL (H)). Liver Function Tests: Recent Labs  Lab 07/10/20 0210  AST 16  ALT 17  ALKPHOS 53  BILITOT 0.7  PROT 6.8  ALBUMIN 3.7   No results for input(s): LIPASE, AMYLASE in the last 168 hours. No results for input(s): AMMONIA in the last 168 hours. Coagulation Profile: Recent Labs  Lab 07/10/20 0210  INR 1.0   Cardiac Enzymes: No results for input(s): CKTOTAL, CKMB, CKMBINDEX, TROPONINI in the last 168 hours. BNP (last 3 results) No results for input(s): PROBNP in the last 8760 hours. HbA1C: Recent Labs    07/10/20 0451  HGBA1C 7.2*   CBG: Recent Labs  Lab 07/10/20 0822 07/10/20 1156  GLUCAP 107* 173*   Lipid Profile: Recent Labs    07/10/20 0451  CHOL 167  HDL 45  LDLCALC 100*  TRIG 111  CHOLHDL 3.7   Thyroid Function Tests: No results for input(s): TSH, T4TOTAL, FREET4, T3FREE, THYROIDAB in the last 72 hours. Anemia Panel: No results for input(s): VITAMINB12, FOLATE, FERRITIN, TIBC, IRON, RETICCTPCT in the last 72 hours. Sepsis Labs: No results for input(s): PROCALCITON, LATICACIDVEN in the last 168 hours.  Recent Results (from the past 240 hour(s))  Resp Panel by RT-PCR (Flu A&B, Covid) Nasopharyngeal Swab     Status: None   Collection Time: 07/10/20  2:10 AM   Specimen: Nasopharyngeal Swab; Nasopharyngeal(NP) swabs in vial transport medium  Result Value  Ref Range Status   SARS Coronavirus 2 by RT PCR NEGATIVE NEGATIVE Final    Comment: (NOTE) SARS-CoV-2 target nucleic acids are NOT DETECTED.  The SARS-CoV-2 RNA is generally detectable in upper respiratory specimens during the acute phase of infection. The lowest concentration of SARS-CoV-2 viral copies this assay can detect is 138 copies/mL. A negative result does not preclude SARS-Cov-2 infection and should not be used as the sole basis for treatment or other patient management decisions. A negative result may occur with  improper specimen collection/handling, submission of specimen other than nasopharyngeal swab, presence of viral mutation(s) within the areas targeted by this assay, and inadequate number of viral copies(<138 copies/mL). A negative result must be combined with clinical observations, patient history, and epidemiological information. The expected result is  Negative.  Fact Sheet for Patients:  BloggerCourse.com  Fact Sheet for Healthcare Providers:  SeriousBroker.it  This test is no t yet approved or cleared by the [place] FDA and  has been authorized for detection and/or diagnosis of SARS-CoV-2 by FDA under an Emergency Use Authorization (EUA). This EUA will remain  in effect (meaning this test can be used) for the duration of the COVID-19 declaration under Section 564(b)(1) of the Act, 21 U.S.C.section 360bbb-3(b)(1), unless the authorization is terminated  or revoked sooner.       Influenza A by PCR NEGATIVE NEGATIVE Final   Influenza B by PCR NEGATIVE NEGATIVE Final    Comment: (NOTE) The Xpert Xpress SARS-CoV-2/FLU/RSV plus assay is intended as an aid in the diagnosis of influenza from Nasopharyngeal swab specimens and should not be used as a sole basis for treatment. Nasal washings and aspirates are unacceptable for Xpert Xpress SARS-CoV-2/FLU/RSV testing.  Fact Sheet for  Patients: BloggerCourse.com  Fact Sheet for Healthcare Providers: SeriousBroker.it  This test is not yet approved or cleared by the [place] FDA and has been authorized for detection and/or diagnosis of SARS-CoV-2 by FDA under an Emergency Use Authorization (EUA). This EUA will remain in effect (meaning this test can be used) for the duration of the COVID-19 declaration under Section 564(b)(1) of the Act, 21 U.S.C. section 360bbb-3(b)(1), unless the authorization is terminated or revoked.  Performed at [facility], [address] [place], [place] [address]      Radiology Studies: CT HEAD WO CONTRAST  Result Date: 07/10/2020 CLINICAL DATA:  Left-sided weakness. Symptoms began at 2100 hours. History of diabetes. EXAM: CT HEAD WITHOUT CONTRAST TECHNIQUE: Contiguous axial images were obtained from the base of the skull through the vertex without intravenous contrast. COMPARISON:  None. FINDINGS: Brain: Mild cerebral atrophy. Mild ventricular dilatation consistent with central atrophy. Prominent low-attenuation changes throughout the deep white matter bilaterally with symmetrical distribution. These are likely small vessel ischemic changes. No definite finding of acute ischemia. No mass-effect or midline shift. No abnormal extra-axial fluid collections. Gray-white matter junctions are distinct. Basal cisterns are not effaced. No acute intracranial hemorrhage. Vascular: No hyperdense vessel or unexpected calcification. Skull: Calvarium appears intact. Sinuses/Orbits: Postoperative changes in the right globe. Old right medial orbital wall fracture deformity. Paranasal sinuses and mastoid air cells are clear. Other: None. IMPRESSION: 1. No acute intracranial abnormalities. 2. Chronic atrophy and small vessel ischemic changes. Electronically Signed   By: Burman Nieves M.D.   On: 07/10/2020 02:44   MR ANGIO HEAD WO  CONTRAST  Addendum Date: 07/10/2020   ADDENDUM REPORT: 07/10/2020 10:31 ADDENDUM: These results were called by telephone at the time of interpretation on 07/10/2020 at 10:19 am to provider Dr. Rhona Leavens, Who verbally acknowledged these results. Electronically Signed   By: Stana Bunting M.D.   On: 07/10/2020 10:31   Result Date: 07/10/2020 CLINICAL DATA:  Neuro deficit, acute, stroke suspected, left lower extremity weakness. EXAM: MRI HEAD WITHOUT CONTRAST MRA HEAD WITHOUT CONTRAST TECHNIQUE: Multiplanar, multiecho pulse sequences of the brain and surrounding structures were obtained without intravenous contrast. Angiographic images of the head were obtained using MRA technique without contrast. COMPARISON:  07/10/2020 head CT. FINDINGS: MRI HEAD FINDINGS Brain: Multifocal restricted diffusion involving the right frontal lobe. No intracranial hemorrhage. No midline shift, ventriculomegaly or extra-axial fluid collection. No mass lesion. Mild cerebral atrophy with ex vacuo dilatation. Chronic right centrum semiovale lacunar insult. Advanced chronic microvascular ischemic changes. Vascular: Please see MRA. Skull and upper cervical  spine: Normal marrow signal. Sinuses/Orbits: Postprocedural appearance of the right orbit. Minimal left maxillary sinus mucosal thickening. Clear mastoid air cells. Other: Please note that image quality is mildly degraded by motion artifact. MRA HEAD FINDINGS Anterior circulation: Patent ICAs. Moderate narrowing of the proximal right M1 segment. Distal right MCA branches demonstrate asymmetrically diminished opacification. Patent ACAs. No aneurysm. Posterior circulation: Dominant left vertebral artery. Patent V4 segments and proximal PICA. Patent basilar, superior cerebellar and posterior cerebral arteries. Fetal origin of the left PCA. No aneurysm. Venous sinuses: No evidence of thrombosis. Anatomic variants: Discussed above. IMPRESSION: Multifocal acute right frontal infarcts.  Chronic right centrum semiovale lacunar insult. Mild cerebral atrophy. Advanced chronic microvascular ischemic changes. Moderate right M1 segment narrowing with asymmetrically diminished opacification of right MCA distal branches. Electronically Signed: By: Stana Bunting M.D. On: 07/10/2020 10:17   MR BRAIN WO CONTRAST  Addendum Date: 07/10/2020   ADDENDUM REPORT: 07/10/2020 10:31 ADDENDUM: These results were called by telephone at the time of interpretation on 07/10/2020 at 10:19 am to provider Dr. Rhona Leavens, Who verbally acknowledged these results. Electronically Signed   By: Stana Bunting M.D.   On: 07/10/2020 10:31   Result Date: 07/10/2020 CLINICAL DATA:  Neuro deficit, acute, stroke suspected, left lower extremity weakness. EXAM: MRI HEAD WITHOUT CONTRAST MRA HEAD WITHOUT CONTRAST TECHNIQUE: Multiplanar, multiecho pulse sequences of the brain and surrounding structures were obtained without intravenous contrast. Angiographic images of the head were obtained using MRA technique without contrast. COMPARISON:  07/10/2020 head CT. FINDINGS: MRI HEAD FINDINGS Brain: Multifocal restricted diffusion involving the right frontal lobe. No intracranial hemorrhage. No midline shift, ventriculomegaly or extra-axial fluid collection. No mass lesion. Mild cerebral atrophy with ex vacuo dilatation. Chronic right centrum semiovale lacunar insult. Advanced chronic microvascular ischemic changes. Vascular: Please see MRA. Skull and upper cervical spine: Normal marrow signal. Sinuses/Orbits: Postprocedural appearance of the right orbit. Minimal left maxillary sinus mucosal thickening. Clear mastoid air cells. Other: Please note that image quality is mildly degraded by motion artifact. MRA HEAD FINDINGS Anterior circulation: Patent ICAs. Moderate narrowing of the proximal right M1 segment. Distal right MCA branches demonstrate asymmetrically diminished opacification. Patent ACAs. No aneurysm. Posterior circulation:  Dominant left vertebral artery. Patent V4 segments and proximal PICA. Patent basilar, superior cerebellar and posterior cerebral arteries. Fetal origin of the left PCA. No aneurysm. Venous sinuses: No evidence of thrombosis. Anatomic variants: Discussed above. IMPRESSION: Multifocal acute right frontal infarcts. Chronic right centrum semiovale lacunar insult. Mild cerebral atrophy. Advanced chronic microvascular ischemic changes. Moderate right M1 segment narrowing with asymmetrically diminished opacification of right MCA distal branches. Electronically Signed: By: Stana Bunting M.D. On: 07/10/2020 10:17   US Carotid Bilateral  Result Date: 07/10/2020 CLINICAL DATA:  Stroke. Hypertension, diabetes, previous tobacco abuse EXAM: BILATERAL CAROTID DUPLEX ULTRASOUND TECHNIQUE: Wallace Cullens scale imaging, color Doppler and duplex ultrasound were performed of bilateral carotid and vertebral arteries in the neck. COMPARISON:  None. FINDINGS: Criteria: Quantification of carotid stenosis is based on velocity parameters that correlate the residual internal carotid diameter with NASCET-based stenosis levels, using the diameter of the distal internal carotid lumen as the denominator for stenosis measurement. The following velocity measurements were obtained: RIGHT ICA: 75/26 cm/sec CCA: 61/12 cm/sec SYSTOLIC ICA/CCA RATIO:  1.5 ECA: 67 cm/sec LEFT ICA: 51/16 cm/sec CCA: 69/12 cm/sec SYSTOLIC ICA/CCA RATIO:  0.9 ECA: 67 cm/sec RIGHT CAROTID ARTERY: Intimal thickening in the common carotid artery. Eccentric smooth noncalcified plaque in the proximal ICA with no significant stenosis. Normal waveforms and color Doppler  signal throughout. RIGHT VERTEBRAL ARTERY:  Normal flow direction and waveform. LEFT CAROTID ARTERY: Intimal thickening in the common carotid artery and bulb. No focal plaque accumulation or stenosis. Normal waveforms and color Doppler signal throughout. LEFT VERTEBRAL ARTERY:  Normal flow direction and waveform.  IMPRESSION: 1. Mild proximal right ICA plaque resulting in less than 50% diameter stenosis. 2.  Antegrade bilateral vertebral arterial flow. Electronically Signed   By: Corlis Leak  Hassell M.D.   On: 07/10/2020 10:00    Scheduled Meds: .  stroke: mapping our early stages of recovery book   Does not apply Once  . aspirin  300 mg Rectal Daily   Or  . aspirin EC  325 mg Oral Daily  . atorvastatin  40 mg Oral q1800  . clopidogrel  75 mg Oral Daily  . enoxaparin (LOVENOX) injection  40 mg Subcutaneous Q24H  . insulin aspart  0-15 Units Subcutaneous TID WC  . insulin aspart  0-5 Units Subcutaneous QHS   Continuous Infusions: . sodium chloride 75 mL/hr at 07/10/20 1306     LOS: 0 days   Rickey BarbaraStephen Duquan Gillooly, MD [facility] Pager On Amion  If 7PM-7AM, please contact night-coverage 07/10/2020, 2:14 PM

## 2020-07-10 NOTE — H&P (Signed)
History and Physical    Roger Gilmore WYO:[id number] DOB: [birth year] DOA: 07/10/2020  PCP: Gracelyn Nurse, MD   Patient coming from: Home  I have personally briefly reviewed patient's old medical records in [facility]  Chief Complaint: Left-sided weakness  HPI: Roger Gilmore is a 69 y.o. male with medical history significant for DM, HTN, glaucoma related blindness, morbid obesity who presents to the emergency room with left-sided weakness.  Patient stated that he noticed weakness in his left upper extremity while he was looking at TV that persisted when he went to bed at around 2100.  At around midnight, when he awoke to go to the bathroom he felt like his left leg was heavy and noticed weakness in his left arm.  He had no weakness in the face, slurring of the speech, and had no headache, nausea or vomiting his girlfriend called EMS.  She states that he was unable to stand up without assistance and she helped him get dressed ED Course: Patient arrived in the ER at 2 AM and does not appear to have arrived as code stroke.  On arrival in the emergency room, patient awake and alert, still weak on left side but stating symptoms.  NIHSS reported by the ER of 2.  BP 130/78.  Vitals otherwise within normal limits.  Blood work unremarkable except for creatinine of 1.31 but no recent baseline available CT head shows no acute intracranial findings EKG as reviewed by me : Normal sinus rhythm at 65 with no acute ST-T wave changes TPA not administered in the ER as last known normal outside TPA window, low NIHSS and improving symptoms Hospitalist consulted for admission Review of Systems: As per HPI otherwise all other systems on review of systems negative.    Past Medical History:  Diagnosis Date  . Diabetes mellitus without complication (HCC)    pre-diabetes on metformin  . Glaucoma   . Obesity     Past Surgical History:  Procedure Laterality Date  . EYE SURGERY    . PHOTOCOAGULATION  WITH LASER Left 02/09/2016   Procedure: PHOTOCOAGULATION WITH LASER;  Surgeon: Sherald Hess, MD;  Location: [facility];  Service: Ophthalmology;  Laterality: Left;  oral meds-prediabetes  . TONSILLECTOMY       reports that he has been smoking cigarettes. He has a 11.25 pack-year smoking history. He has never used smokeless tobacco. He reports current alcohol use. He reports that he does not use drugs.  No Known Allergies  Family History  Problem Relation Age of Onset  . Stroke Mother       Prior to Admission medications   Medication Sig Start Date End Date Taking? Authorizing Provider  amLODipine (NORVASC) 5 MG tablet Take 5 mg by mouth daily.  08/06/19  Yes [provider]  aspirin 81 MG chewable tablet Chew 81 mg by mouth daily. am   Yes [provider]  brimonidine (ALPHAGAN) 0.2 % ophthalmic solution Apply 1 drop to eye in the morning, at noon, and at bedtime. 07/26/19  Yes [provider]  dorzolamide-timolol (COSOPT) 22.3-6.8 MG/ML ophthalmic solution INSTILL 1 DROP INTO BOTH EYES TWICE A DAY 11/05/19  Yes [provider]  glimepiride (AMARYL) 4 MG tablet Take 4 mg by mouth daily. 06/23/20  Yes [provider]  hydrochlorothiazide (HYDRODIURIL) 25 MG tablet TAKE 1 TABLET BY MOUTH EVERY DAY 11/21/19  Yes [provider]  latanoprost (XALATAN) 0.005 % ophthalmic solution Place 1 drop into both eyes at  bedtime.  07/26/19  Yes [provider]  losartan (COZAAR) 100 MG tablet TAKE 1 TABLET BY MOUTH EVERY DAY 05/09/19  Yes [provider]  metFORMIN (GLUCOPHAGE) 850 MG tablet Take 850 mg by mouth 2 (two) times daily. 06/19/20  Yes [provider]    Physical Exam: Vitals:   07/10/20 0156 07/10/20 0230 07/10/20 0300  BP: 130/78 111/68 140/83  Pulse: 67 67 65  Resp: 14 13 14   Temp: 98.9 F (37.2 C)    TempSrc: Oral    SpO2: 100% 100% 100%     Vitals:   07/10/20 0156 07/10/20 0230  07/10/20 0300  BP: 130/78 111/68 140/83  Pulse: 67 67 65  Resp: 14 13 14   Temp: 98.9 F (37.2 C)    TempSrc: Oral    SpO2: 100% 100% 100%      Constitutional: Alert and oriented x 3 . Not in any apparent distress HEENT:      Head: Normocephalic and atraumatic.         Eyes: blind. Conjunctivae are normal. Sclera is non-icteric.       Mouth/Throat: Mucous membranes are moist.       Neck: Supple with no signs of meningismus. Cardiovascular: Regular rate and rhythm. No murmurs, gallops, or rubs. 2+ symmetrical distal pulses are present . No JVD. No LE edema Respiratory: Respiratory effort normal .Lungs sounds clear bilaterally. No wheezes, crackles, or rhonchi.  Gastrointestinal: Soft, non tender, and non distended with positive bowel sounds. No rebound or guarding. Genitourinary: No CVA tenderness. Musculoskeletal: Nontender with normal range of motion in all extremities. No cyanosis, or erythema of extremities. Neurologic:  Face is symmetric.  Patient able to lift arm and leg against gravity but unable to maintain with pressure and has downward drift skin: Skin is warm, dry.  No rash or ulcers Psychiatric: Mood and affect are normal    Labs on Admission: I have personally reviewed following labs and imaging studies  CBC: Recent Labs  Lab 07/10/20 0210  WBC 5.5  NEUTROABS 2.4  HGB 14.2  HCT 43.3  MCV 86.9  PLT 193   Basic Metabolic Panel: Recent Labs  Lab 07/10/20 0210  NA 139  K 3.6  CL 104  CO2 23  GLUCOSE 109*  BUN 15  CREATININE 1.31*  CALCIUM 8.8*   GFR: CrCl cannot be calculated (Unknown ideal weight.). Liver Function Tests: Recent Labs  Lab 07/10/20 0210  AST 16  ALT 17  ALKPHOS 53  BILITOT 0.7  PROT 6.8  ALBUMIN 3.7   No results for input(s): LIPASE, AMYLASE in the last 168 hours. No results for input(s): AMMONIA in the last 168 hours. Coagulation Profile: Recent Labs  Lab 07/10/20 0210  INR 1.0   Cardiac Enzymes: No results for  input(s): CKTOTAL, CKMB, CKMBINDEX, TROPONINI in the last 168 hours. BNP (last 3 results) No results for input(s): PROBNP in the last 8760 hours. HbA1C: No results for input(s): HGBA1C in the last 72 hours. CBG: No results for input(s): GLUCAP in the last 168 hours. Lipid Profile: No results for input(s): CHOL, HDL, LDLCALC, TRIG, CHOLHDL, LDLDIRECT in the last 72 hours. Thyroid Function Tests: No results for input(s): TSH, T4TOTAL, FREET4, T3FREE, THYROIDAB in the last 72 hours. Anemia Panel: No results for input(s): VITAMINB12, FOLATE, FERRITIN, TIBC, IRON, RETICCTPCT in the last 72 hours. Urine analysis:    Component Value Date/Time   COLORURINE YELLOW (A) 08/21/2017 1850   APPEARANCEUR CLEAR (A) 08/21/2017 1850   LABSPEC 1.027 08/21/2017  1850   PHURINE 5.0 08/21/2017 1850   GLUCOSEU >=500 (A) 08/21/2017 1850   HGBUR NEGATIVE 08/21/2017 1850   BILIRUBINUR NEGATIVE 08/21/2017 1850   KETONESUR NEGATIVE 08/21/2017 1850   PROTEINUR NEGATIVE 08/21/2017 1850   NITRITE NEGATIVE 08/21/2017 1850   LEUKOCYTESUR NEGATIVE 08/21/2017 1850    Radiological Exams on Admission: CT HEAD WO CONTRAST  Result Date: 07/10/2020 CLINICAL DATA:  Left-sided weakness. Symptoms began at 2100 hours. History of diabetes. EXAM: CT HEAD WITHOUT CONTRAST TECHNIQUE: Contiguous axial images were obtained from the base of the skull through the vertex without intravenous contrast. COMPARISON:  None. FINDINGS: Brain: Mild cerebral atrophy. Mild ventricular dilatation consistent with central atrophy. Prominent low-attenuation changes throughout the deep white matter bilaterally with symmetrical distribution. These are likely small vessel ischemic changes. No definite finding of acute ischemia. No mass-effect or midline shift. No abnormal extra-axial fluid collections. Gray-white matter junctions are distinct. Basal cisterns are not effaced. No acute intracranial hemorrhage. Vascular: No hyperdense vessel or unexpected  calcification. Skull: Calvarium appears intact. Sinuses/Orbits: Postoperative changes in the right globe. Old right medial orbital wall fracture deformity. Paranasal sinuses and mastoid air cells are clear. Other: None. IMPRESSION: 1. No acute intracranial abnormalities. 2. Chronic atrophy and small vessel ischemic changes. Electronically Signed   By: Burman Nieves M.D.   On: 07/10/2020 02:44     Assessment/Plan 69 year old male with history of DM, HTN, glaucoma, morbid obesity who presents to the emergency room with left-sided weakness    Acute CVA (cerebrovascular accident) [facility]) -Patient presented with left hemiparesis, presenting outside TPA window -Head CT negative -Follow-up MRI/MRA -Routine stroke work-up to include echocardiogram, carotid Doppler, continuous cardiac monitoring -Neurology consult, PT speech on OT consults -Oral aspirin if patient passes swallow study or rectal aspirin -Aspirin 81 and Plavix 75 daily for 30-day overlap.  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 This is judged to be reasonable and necessary in order to provide the required intensity of service to ensure the patient's safety given the presenting symptoms, physical exam findings, and initial radiographic and laboratory data in the context of  their  Comorbid conditions.   Patient requires inpatient status due to high intensity of service, high risk for further deterioration and high frequency of surveillance required.   I certify that at the point of admission it is my clinical judgment that the patient will require inpatient hospital care spanning beyond 2 midnights     Andris Baumann MD [facility]     07/10/2020, 4:34 AM

## 2020-07-10 NOTE — ED Notes (Signed)
Pt updated on plan of care, denies questions or concerns at this time. SO remains at bedside.

## 2020-07-10 NOTE — ED Triage Notes (Signed)
Pt to ED from home for L sided weakness to upper and lower extremity. Hx of diabetes, pt blind. Symptoms started at 2100, pt fell asleep, woke up to use restroom and felt "heaviness" in L leg. BG per EMS 116. BP 160s/100s. Denies hx of CVA. -Thinners. AO x4. Talking in full sentences with regular and unlabored breathing.

## 2020-07-10 NOTE — ED Provider Notes (Signed)
[name] Emergency Department Provider Note   ____________________________________________   First MD Initiated Contact with Patient 07/10/20 0151     (approximate)  I have reviewed the triage vital signs and the nursing notes.   HISTORY  Chief Complaint Weakness    HPI Roger Gilmore is a 69 y.o. male with a stated past medical history of type 2 diabetes, glaucoma, blindness, and hypertension who presents for left upper and lower extremity weakness and a sensation of "heaviness" that began at 2100 and is slightly improved since onset.  Patient denies any similar symptoms in the past.  Patient and significant other bedside deny any slurred speech or facial droop.          Past Medical History:  Diagnosis Date   Diabetes mellitus without complication (HCC)    pre-diabetes on metformin   Glaucoma    Obesity     Patient Active Problem List   Diagnosis Date Noted   Blindness 11/29/2019   DM type 2 (diabetes mellitus, type 2) (HCC) 11/29/2019   Hypertension 11/29/2019   Morbid obesity with BMI of 50.0-59.9, adult (HCC) 11/29/2019   Erectile dysfunction 11/19/2016    Past Surgical History:  Procedure Laterality Date   EYE SURGERY     PHOTOCOAGULATION WITH LASER Left 02/09/2016   Procedure: PHOTOCOAGULATION WITH LASER;  Surgeon: Sherald Hess, MD;  Location: [facility];  Service: Ophthalmology;  Laterality: Left;  oral meds-prediabetes   TONSILLECTOMY      Prior to Admission medications   Medication Sig Start Date End Date Taking? Authorizing Provider  amLODipine (NORVASC) 5 MG tablet Take by mouth. 08/06/19   [provider]  aspirin 81 MG chewable tablet Chew 81 mg by mouth daily. am    [provider]  dorzolamide-timolol (COSOPT) 22.3-6.8 MG/ML ophthalmic solution INSTILL 1 DROP INTO BOTH EYES TWICE A DAY 11/05/19   [provider]  glimepiride (AMARYL) 4 MG tablet Take 4 mg by  mouth daily. 06/23/20   [provider]  hydrochlorothiazide (HYDRODIURIL) 25 MG tablet TAKE 1 TABLET BY MOUTH EVERY DAY 11/21/19   [provider]  latanoprost (XALATAN) 0.005 % ophthalmic solution Apply to eye. 07/26/19   [provider]  losartan (COZAAR) 100 MG tablet TAKE 1 TABLET BY MOUTH EVERY DAY 05/09/19   [provider]  metFORMIN (GLUCOPHAGE) 850 MG tablet Take 850 mg by mouth 2 (two) times daily. 06/19/20   [provider]    Allergies Patient has no known allergies.  No family history on file.  Social History Social History   Tobacco Use   Smoking status: Current Every Day Smoker    Packs/day: 0.25    Years: 45.00    Pack years: 11.25    Types: Cigarettes   Smokeless tobacco: Never Used  Substance Use Topics   Alcohol use: Yes    Comment: occasional   Drug use: No    Review of Systems Constitutional: No fever/chills Eyes: No visual changes. ENT: No sore throat. Cardiovascular: Denies chest pain. Respiratory: Denies shortness of breath. Gastrointestinal: No abdominal pain.  No nausea, no vomiting.  No diarrhea. Genitourinary: Negative for dysuria. Musculoskeletal: Negative for acute arthralgias Skin: Negative for rash. Neurological: Negative for headaches, endorses weakness and altered sensation to the left upper and lower extremities Psychiatric: Negative for suicidal ideation/homicidal ideation   ____________________________________________   PHYSICAL EXAM:  VITAL SIGNS: ED Triage Vitals [07/10/20 0156]  Enc Vitals Group     BP 130/78  Pulse Rate 67     Resp 14     Temp 98.9 F (37.2 C)     Temp Source Oral     SpO2 100 %     Weight      Height      Head Circumference      Peak Flow      Pain Score 0     Pain Loc      Pain Edu?      Excl. in GC?    Constitutional: Alert and oriented. Well appearing and in no acute distress. Eyes: Conjunctivae are normal. PERRL. Head: Atraumatic. Nose: No  congestion/rhinnorhea. Mouth/Throat: Mucous membranes are moist. Neck: No stridor Cardiovascular: Grossly normal heart sounds.  Good peripheral circulation. Respiratory: Normal respiratory effort.  No retractions. Gastrointestinal: Soft and nontender. No distention. Musculoskeletal: No obvious deformities Neurologic:  Normal speech and language.  Weakness in left upper and lower extremities with drift in the lower and upper extremity on the left Skin:  Skin is warm and dry. No rash noted. Psychiatric: Mood and affect are normal. Speech and behavior are normal.  ____________________________________________   LABS (all labs ordered are listed, but only abnormal results are displayed)  Labs Reviewed  COMPREHENSIVE METABOLIC PANEL - Abnormal; Notable for the following components:      Result Value   Glucose, Bld 109 (*)    Creatinine, Ser 1.31 (*)    Calcium 8.8 (*)    GFR, Estimated 59 (*)    All other components within normal limits  RESP PANEL BY RT-PCR (FLU A&B, COVID) ARPGX2  PROTIME-INR  APTT  CBC  DIFFERENTIAL  ETHANOL  URINE DRUG SCREEN, QUALITATIVE ([facility] ONLY)  URINALYSIS, ROUTINE W REFLEX MICROSCOPIC   ____________________________________________  EKG  ED ECG REPORT I, Merwyn Katos, the attending physician, personally viewed and interpreted this ECG.  Date: 07/10/2020 EKG Time: 0156 Rate: 68 Rhythm: normal sinus rhythm QRS Axis: normal Intervals: normal ST/T Wave abnormalities: normal Narrative Interpretation: no evidence of acute ischemia  ____________________________________________  RADIOLOGY  ED MD interpretation: CT of the head without contrast shows chronic atrophy and small vessel ischemic changes without any evidence of acute intracranial abnormalities including no mass-effect, midline shift, hemorrhage, or significant edema  Official radiology report(s): CT HEAD WO CONTRAST  Result Date: 07/10/2020 CLINICAL DATA:  Left-sided weakness.  Symptoms began at 2100 hours. History of diabetes. EXAM: CT HEAD WITHOUT CONTRAST TECHNIQUE: Contiguous axial images were obtained from the base of the skull through the vertex without intravenous contrast. COMPARISON:  None. FINDINGS: Brain: Mild cerebral atrophy. Mild ventricular dilatation consistent with central atrophy. Prominent low-attenuation changes throughout the deep white matter bilaterally with symmetrical distribution. These are likely small vessel ischemic changes. No definite finding of acute ischemia. No mass-effect or midline shift. No abnormal extra-axial fluid collections. Gray-white matter junctions are distinct. Basal cisterns are not effaced. No acute intracranial hemorrhage. Vascular: No hyperdense vessel or unexpected calcification. Skull: Calvarium appears intact. Sinuses/Orbits: Postoperative changes in the right globe. Old right medial orbital wall fracture deformity. Paranasal sinuses and mastoid air cells are clear. Other: None. IMPRESSION: 1. No acute intracranial abnormalities. 2. Chronic atrophy and small vessel ischemic changes. Electronically Signed   By: Burman Nieves M.D.   On: 07/10/2020 02:44    ____________________________________________   PROCEDURES  Procedure(s) performed (including Critical Care):  .1-3 Lead EKG Interpretation Performed by: Merwyn Katos, MD Authorized by: Merwyn Katos, MD     Interpretation: normal     ECG  rate:  70   ECG rate assessment: normal     Rhythm: sinus rhythm     Ectopy: none     Conduction: normal       ____________________________________________   INITIAL IMPRESSION / ASSESSMENT AND PLAN / ED COURSE  As part of my medical decision making, I reviewed the following data within the electronic MEDICAL RECORD NUMBER Nursing notes reviewed and incorporated, Labs reviewed, EKG interpreted, Old chart reviewed, Radiograph reviewed and Notes from prior ED visits reviewed and incorporated     Patient is a 69 year old  male who presents with symptoms concerning for TIA PMH risk factors: Hypertension Neurologic Deficits: Left upper and lower extremity weakness Last known Well Time: 2100 NIH Stroke Score: 2 Given History and Exam I have lower suspicion for infectious etiology, neurologic changes secondary to toxicologic ingestion, seizure, complex migraine. Presentation concerning for possible stroke requiring workup.  Workup: Labs: POC glucose, CBC, BMP, LFTs, Troponin, PT/INR, PTT, Type and Screen Other Diagnostics: ECG, CXR, non-contrast head CT followed by CTA brain and neck (to r/o large vessel occlusion amenable to thrombectomy) Interventions: Patient's not eligible for TPA due to resolution of symptoms prior to evaluation  Consult: hospitalist Disposition: Admit      ____________________________________________   FINAL CLINICAL IMPRESSION(S) / ED DIAGNOSES  Final diagnoses:  Weakness of left upper extremity  Weakness of left lower extremity     ED Discharge Orders    None       Note:  This document was prepared using Dragon voice recognition software and may include unintentional dictation errors.   Merwyn Katos, MD 07/10/20 [phone]

## 2020-07-10 NOTE — Progress Notes (Signed)
*  PRELIMINARY RESULTS* Echocardiogram 2D Echocardiogram has been performed.  Roger Gilmore 07/10/2020, 2:51 PM

## 2020-07-10 NOTE — ED Notes (Signed)
MD Para March at bedside, AO x4, pt reports no change in heaviness to LUE/LLE

## 2020-07-10 NOTE — Progress Notes (Signed)
Physical Therapy Evaluation Patient Details Name: Roger Gilmore MRN: [id number] DOB: 1951-05-16 Today's Date: 07/10/2020   History of Present Illness  Per MD note:Roger Gilmore is a 69 y.o. male with medical history significant for DM, HTN, glaucoma related blindness, morbid obesity who presents to the emergency room with left-sided weakness.  Patient stated that he noticed weakness in his left upper extremity while he was looking at TV that persisted when he went to bed at around 2100.  At around midnight, when he awoke to go to the bathroom he felt like his left leg was heavy and noticed weakness in his left arm.  He had no weakness in the face, slurring of the speech, and had no headache, nausea or vomiting his girlfriend called EMS.  She states that he was unable to stand up without assistance and she helped him get dressed  Clinical Impression  Patient agrees to PT eval. He lives with his girlfriend and has 14 steps to the bedroom. He has -3/5 BLE hip flex and knee extension strength.He has good sitting balance and good standing balance with UE support with RW. He is min assist  with bed mobility, min assist for transfers sit to stand with RW. He ambulates with RW 75 feet with min assist.  Patient will continue to benefit from skilled PT to improve mobility and strength.    Follow Up Recommendations Home health PT    Equipment Recommendations  Rolling walker with 5" wheels    Recommendations for Other Services OT consult     Precautions / Restrictions Precautions Precautions: Other (comment) Precaution Comments:  (blind) Restrictions Weight Bearing Restrictions: No      Mobility  Bed Mobility Overal bed mobility: Needs Assistance Bed Mobility: Supine to Sit;Sit to Supine     Supine to sit: Min assist Sit to supine: Min guard        Transfers Overall transfer level: Modified independent Equipment used: Rolling walker (2 wheeled)             General transfer  comment: cues for UE placaement  Ambulation/Gait Ambulation/Gait assistance: Modified independent (Device/Increase time) Gait Distance (Feet): 75 Feet Assistive device: Rolling walker (2 wheeled) Gait Pattern/deviations: Step-to pattern Gait velocity: decreased      Stairs            Wheelchair Mobility    Modified Rankin (Stroke Patients Only)       Balance Overall balance assessment: Modified Independent                                           Pertinent Vitals/Pain Pain Assessment: No/denies pain    Home Living Family/patient expects to be discharged to:: Private residence Living Arrangements: Spouse/significant other Available Help at Discharge: Friend(s) Type of Home: House Home Access: Stairs to enter Entrance Stairs-Rails: Right Entrance Stairs-Number of Steps: 14 Home Layout: Two level Home Equipment: None      Prior Function Level of Independence: Independent with assistive device(s)         Comments:  (Pt is blind and amabualted slowly)     Hand Dominance        Extremity/Trunk Assessment   Upper Extremity Assessment Upper Extremity Assessment: LUE deficits/detail LUE Deficits / Details: 3/5 LUE shoulder flex and abd LUE Coordination: decreased fine motor;decreased gross motor    Lower Extremity Assessment Lower Extremity Assessment: LLE deficits/detail LLE  Deficits / Details: -3/5 hip flex  LLE Coordination: decreased gross motor       Communication   Communication: No difficulties  Cognition Arousal/Alertness: Awake/alert Behavior During Therapy: WFL for tasks assessed/performed Overall Cognitive Status: Within Functional Limits for tasks assessed                                        General Comments      Exercises     Assessment/Plan    PT Assessment Patient needs continued PT services  PT Problem List Decreased strength;Decreased activity tolerance;Decreased mobility        PT Treatment Interventions Gait training;Therapeutic activities;Therapeutic exercise    PT Goals (Current goals can be found in the Care Plan section)  Acute Rehab PT Goals Patient Stated Goal: to walk PT Goal Formulation: With patient Time For Goal Achievement: 07/24/20 Potential to Achieve Goals: Good    Frequency 7X/week   Barriers to discharge Inaccessible home environment      Co-evaluation               AM-PAC PT "6 Clicks" Mobility  Outcome Measure Help needed turning from your back to your side while in a flat bed without using bedrails?: None Help needed moving from lying on your back to sitting on the side of a flat bed without using bedrails?: A Little Help needed moving to and from a bed to a chair (including a wheelchair)?: A Little Help needed standing up from a chair using your arms (e.g., wheelchair or bedside chair)?: A Little Help needed to walk in hospital room?: A Little Help needed climbing 3-5 steps with a railing? : A Little 6 Click Score: 19    End of Session Equipment Utilized During Treatment: Gait belt Activity Tolerance: Patient tolerated treatment well Patient left: in bed;with call bell/phone within reach;with bed alarm set Nurse Communication: Mobility status PT Visit Diagnosis: Unsteadiness on feet (R26.81);Muscle weakness (generalized) (M62.81);Difficulty in walking, not elsewhere classified (R26.2)    Time: 1517-6160 PT Time Calculation (min) (ACUTE ONLY): 20 min   Charges:   PT Evaluation $PT Eval Low Complexity: 1 Low PT Treatments $Gait Training: 8-22 mins          Ezekiel Ina, PT DPT 07/10/2020, 11:56 AM

## 2020-07-10 NOTE — ED Notes (Signed)
MRI screening complete via telephone with pt and MRI tech

## 2020-07-10 NOTE — ED Notes (Signed)
US at bedside

## 2020-07-10 NOTE — Consult Note (Signed)
Reason for Consult:LLE weakness  Requesting Physician: Dr. Rhona Leavens  CC: RLE weakness    HPI: Roger Gilmore is an 69 y.o. male with medical history significant for DM, HTN, glaucoma related blindness, morbid obesity who presents to the emergency room with left leg weakness  Patient stated that he noticed weakness in his left lower extremity while he was looking at TV that persisted when he went to bed at around 2100. Now improved but still not at baseline  Past Medical History:  Diagnosis Date  . Blind in both eyes   . Diabetes mellitus without complication (HCC)    pre-diabetes on metformin  . Glaucoma   . Obesity     Past Surgical History:  Procedure Laterality Date  . EYE SURGERY    . PHOTOCOAGULATION WITH LASER Left 02/09/2016   Procedure: PHOTOCOAGULATION WITH LASER;  Surgeon: Sherald Hess, MD;  Location: [facility];  Service: Ophthalmology;  Laterality: Left;  oral meds-prediabetes  . TONSILLECTOMY      Family History  Problem Relation Age of Onset  . Stroke Mother     Social History:  reports that he has been smoking cigarettes. He has a 11.25 pack-year smoking history. He has never used smokeless tobacco. He reports current alcohol use. He reports that he does not use drugs.  No Known Allergies  Medications: I have reviewed the patient's current medications.   Physical Examination: Blood pressure (!) 144/77, pulse (!) 56, temperature 98 F (36.7 C), resp. rate 18, height 5\' 6"  (1.676 m), weight (!) 141.5 kg, SpO2 99 %.   Neurological Examination   Mental Status: Alert, oriented, thought content appropriate.  Speech fluent without evidence of aphasia.  Able to follow 3 step commands without difficulty. Cranial Nerves: II: Blind from glaucoma V,VII: smile symmetric, facial light touch sensation normal bilaterally VIII: hearing normal bilaterally IX,X: gag reflex present XI: bilateral shoulder shrug XII: midline tongue extension Motor: Right  : Upper extremity   5/5    Left:     Upper extremity   5/5  Lower extremity   5/5     Lower extremity   3/5 Tone and bulk:normal tone throughout; no atrophy noted Sensory: Pinprick and light touch intact throughout, bilaterally    Laboratory Studies:   Basic Metabolic Panel: Recent Labs  Lab 07/10/20 0210  NA 139  K 3.6  CL 104  CO2 23  GLUCOSE 109*  BUN 15  CREATININE 1.31*  CALCIUM 8.8*    Liver Function Tests: Recent Labs  Lab 07/10/20 0210  AST 16  ALT 17  ALKPHOS 53  BILITOT 0.7  PROT 6.8  ALBUMIN 3.7   No results for input(s): LIPASE, AMYLASE in the last 168 hours. No results for input(s): AMMONIA in the last 168 hours.  CBC: Recent Labs  Lab 07/10/20 0210  WBC 5.5  NEUTROABS 2.4  HGB 14.2  HCT 43.3  MCV 86.9  PLT 193    Cardiac Enzymes: No results for input(s): CKTOTAL, CKMB, CKMBINDEX, TROPONINI in the last 168 hours.  BNP: Invalid input(s): POCBNP  CBG: Recent Labs  Lab 07/10/20 0822 07/10/20 1156  GLUCAP 107* 173*    Microbiology: Results for orders placed or performed during the hospital encounter of 07/10/20  Resp Panel by RT-PCR (Flu A&B, Covid) Nasopharyngeal Swab     Status: None   Collection Time: 07/10/20  2:10 AM   Specimen: Nasopharyngeal Swab; Nasopharyngeal(NP) swabs in vial transport medium  Result Value Ref Range Status   SARS Coronavirus  2 by RT PCR NEGATIVE NEGATIVE Final    Comment: (NOTE) SARS-CoV-2 target nucleic acids are NOT DETECTED.  The SARS-CoV-2 RNA is generally detectable in upper respiratory specimens during the acute phase of infection. The lowest concentration of SARS-CoV-2 viral copies this assay can detect is 138 copies/mL. A negative result does not preclude SARS-Cov-2 infection and should not be used as the sole basis for treatment or other patient management decisions. A negative result may occur with  improper specimen collection/handling, submission of specimen other than nasopharyngeal swab,  presence of viral mutation(s) within the areas targeted by this assay, and inadequate number of viral copies(<138 copies/mL). A negative result must be combined with clinical observations, patient history, and epidemiological information. The expected result is Negative.  Fact Sheet for Patients:  BloggerCourse.comhttps://www.fda.gov/media/152166/download  Fact Sheet for Healthcare Providers:  SeriousBroker.ithttps://www.fda.gov/media/152162/download  This test is no t yet approved or cleared by the [place] FDA and  has been authorized for detection and/or diagnosis of SARS-CoV-2 by FDA under an Emergency Use Authorization (EUA). This EUA will remain  in effect (meaning this test can be used) for the duration of the COVID-19 declaration under Section 564(b)(1) of the Act, 21 U.S.C.section 360bbb-3(b)(1), unless the authorization is terminated  or revoked sooner.       Influenza A by PCR NEGATIVE NEGATIVE Final   Influenza B by PCR NEGATIVE NEGATIVE Final    Comment: (NOTE) The Xpert Xpress SARS-CoV-2/FLU/RSV plus assay is intended as an aid in the diagnosis of influenza from Nasopharyngeal swab specimens and should not be used as a sole basis for treatment. Nasal washings and aspirates are unacceptable for Xpert Xpress SARS-CoV-2/FLU/RSV testing.  Fact Sheet for Patients: BloggerCourse.comhttps://www.fda.gov/media/152166/download  Fact Sheet for Healthcare Providers: SeriousBroker.ithttps://www.fda.gov/media/152162/download  This test is not yet approved or cleared by the [place] FDA and has been authorized for detection and/or diagnosis of SARS-CoV-2 by FDA under an Emergency Use Authorization (EUA). This EUA will remain in effect (meaning this test can be used) for the duration of the COVID-19 declaration under Section 564(b)(1) of the Act, 21 U.S.C. section 360bbb-3(b)(1), unless the authorization is terminated or revoked.  Performed at [facility], [address] [place], [place] [address]      Coagulation Studies: Recent Labs    07/10/20 0210  LABPROT 13.2  INR 1.0    Urinalysis: No results for input(s): COLORURINE, LABSPEC, PHURINE, GLUCOSEU, HGBUR, BILIRUBINUR, KETONESUR, PROTEINUR, UROBILINOGEN, NITRITE, LEUKOCYTESUR in the last 168 hours.  Invalid input(s): APPERANCEUR  Lipid Panel:     Component Value Date/Time   CHOL 167 07/10/2020 0451   TRIG 111 07/10/2020 0451   HDL 45 07/10/2020 0451   CHOLHDL 3.7 07/10/2020 0451   VLDL 22 07/10/2020 0451   LDLCALC 100 (H) 07/10/2020 0451    HgbA1C:  Lab Results  Component Value Date   HGBA1C 7.2 (H) 07/10/2020    Urine Drug Screen:  No results found for: LABOPIA, COCAINSCRNUR, LABBENZ, AMPHETMU, THCU, LABBARB  Alcohol Level:  Recent Labs  Lab 07/10/20 0210  ETH <10    Other results: EKG: normal EKG, normal sinus rhythm, unchanged from previous tracings.  Imaging: CT HEAD WO CONTRAST  Result Date: 07/10/2020 CLINICAL DATA:  Left-sided weakness. Symptoms began at 2100 hours. History of diabetes. EXAM: CT HEAD WITHOUT CONTRAST TECHNIQUE: Contiguous axial images were obtained from the base of the skull through the vertex without intravenous contrast. COMPARISON:  None. FINDINGS: Brain: Mild cerebral atrophy. Mild ventricular dilatation consistent with central atrophy. Prominent low-attenuation changes  throughout the deep white matter bilaterally with symmetrical distribution. These are likely small vessel ischemic changes. No definite finding of acute ischemia. No mass-effect or midline shift. No abnormal extra-axial fluid collections. Gray-white matter junctions are distinct. Basal cisterns are not effaced. No acute intracranial hemorrhage. Vascular: No hyperdense vessel or unexpected calcification. Skull: Calvarium appears intact. Sinuses/Orbits: Postoperative changes in the right globe. Old right medial orbital wall fracture deformity. Paranasal sinuses and mastoid air cells are clear. Other: None. IMPRESSION: 1.  No acute intracranial abnormalities. 2. Chronic atrophy and small vessel ischemic changes. Electronically Signed   By: Burman Nieves M.D.   On: 07/10/2020 02:44   MR ANGIO HEAD WO CONTRAST  Addendum Date: 07/10/2020   ADDENDUM REPORT: 07/10/2020 10:31 ADDENDUM: These results were called by telephone at the time of interpretation on 07/10/2020 at 10:19 am to provider Dr. Rhona Leavens, Who verbally acknowledged these results. Electronically Signed   By: Stana Bunting M.D.   On: 07/10/2020 10:31   Result Date: 07/10/2020 CLINICAL DATA:  Neuro deficit, acute, stroke suspected, left lower extremity weakness. EXAM: MRI HEAD WITHOUT CONTRAST MRA HEAD WITHOUT CONTRAST TECHNIQUE: Multiplanar, multiecho pulse sequences of the brain and surrounding structures were obtained without intravenous contrast. Angiographic images of the head were obtained using MRA technique without contrast. COMPARISON:  07/10/2020 head CT. FINDINGS: MRI HEAD FINDINGS Brain: Multifocal restricted diffusion involving the right frontal lobe. No intracranial hemorrhage. No midline shift, ventriculomegaly or extra-axial fluid collection. No mass lesion. Mild cerebral atrophy with ex vacuo dilatation. Chronic right centrum semiovale lacunar insult. Advanced chronic microvascular ischemic changes. Vascular: Please see MRA. Skull and upper cervical spine: Normal marrow signal. Sinuses/Orbits: Postprocedural appearance of the right orbit. Minimal left maxillary sinus mucosal thickening. Clear mastoid air cells. Other: Please note that image quality is mildly degraded by motion artifact. MRA HEAD FINDINGS Anterior circulation: Patent ICAs. Moderate narrowing of the proximal right M1 segment. Distal right MCA branches demonstrate asymmetrically diminished opacification. Patent ACAs. No aneurysm. Posterior circulation: Dominant left vertebral artery. Patent V4 segments and proximal PICA. Patent basilar, superior cerebellar and posterior cerebral  arteries. Fetal origin of the left PCA. No aneurysm. Venous sinuses: No evidence of thrombosis. Anatomic variants: Discussed above. IMPRESSION: Multifocal acute right frontal infarcts. Chronic right centrum semiovale lacunar insult. Mild cerebral atrophy. Advanced chronic microvascular ischemic changes. Moderate right M1 segment narrowing with asymmetrically diminished opacification of right MCA distal branches. Electronically Signed: By: Stana Bunting M.D. On: 07/10/2020 10:17   MR BRAIN WO CONTRAST  Addendum Date: 07/10/2020   ADDENDUM REPORT: 07/10/2020 10:31 ADDENDUM: These results were called by telephone at the time of interpretation on 07/10/2020 at 10:19 am to provider Dr. Rhona Leavens, Who verbally acknowledged these results. Electronically Signed   By: Stana Bunting M.D.   On: 07/10/2020 10:31   Result Date: 07/10/2020 CLINICAL DATA:  Neuro deficit, acute, stroke suspected, left lower extremity weakness. EXAM: MRI HEAD WITHOUT CONTRAST MRA HEAD WITHOUT CONTRAST TECHNIQUE: Multiplanar, multiecho pulse sequences of the brain and surrounding structures were obtained without intravenous contrast. Angiographic images of the head were obtained using MRA technique without contrast. COMPARISON:  07/10/2020 head CT. FINDINGS: MRI HEAD FINDINGS Brain: Multifocal restricted diffusion involving the right frontal lobe. No intracranial hemorrhage. No midline shift, ventriculomegaly or extra-axial fluid collection. No mass lesion. Mild cerebral atrophy with ex vacuo dilatation. Chronic right centrum semiovale lacunar insult. Advanced chronic microvascular ischemic changes. Vascular: Please see MRA. Skull and upper cervical spine: Normal marrow signal. Sinuses/Orbits: Postprocedural appearance of the right  orbit. Minimal left maxillary sinus mucosal thickening. Clear mastoid air cells. Other: Please note that image quality is mildly degraded by motion artifact. MRA HEAD FINDINGS Anterior circulation: Patent  ICAs. Moderate narrowing of the proximal right M1 segment. Distal right MCA branches demonstrate asymmetrically diminished opacification. Patent ACAs. No aneurysm. Posterior circulation: Dominant left vertebral artery. Patent V4 segments and proximal PICA. Patent basilar, superior cerebellar and posterior cerebral arteries. Fetal origin of the left PCA. No aneurysm. Venous sinuses: No evidence of thrombosis. Anatomic variants: Discussed above. IMPRESSION: Multifocal acute right frontal infarcts. Chronic right centrum semiovale lacunar insult. Mild cerebral atrophy. Advanced chronic microvascular ischemic changes. Moderate right M1 segment narrowing with asymmetrically diminished opacification of right MCA distal branches. Electronically Signed: By: Stana Bunting M.D. On: 07/10/2020 10:17   US Carotid Bilateral  Result Date: 07/10/2020 CLINICAL DATA:  Stroke. Hypertension, diabetes, previous tobacco abuse EXAM: BILATERAL CAROTID DUPLEX ULTRASOUND TECHNIQUE: Wallace Cullens scale imaging, color Doppler and duplex ultrasound were performed of bilateral carotid and vertebral arteries in the neck. COMPARISON:  None. FINDINGS: Criteria: Quantification of carotid stenosis is based on velocity parameters that correlate the residual internal carotid diameter with NASCET-based stenosis levels, using the diameter of the distal internal carotid lumen as the denominator for stenosis measurement. The following velocity measurements were obtained: RIGHT ICA: 75/26 cm/sec CCA: 61/12 cm/sec SYSTOLIC ICA/CCA RATIO:  1.5 ECA: 67 cm/sec LEFT ICA: 51/16 cm/sec CCA: 69/12 cm/sec SYSTOLIC ICA/CCA RATIO:  0.9 ECA: 67 cm/sec RIGHT CAROTID ARTERY: Intimal thickening in the common carotid artery. Eccentric smooth noncalcified plaque in the proximal ICA with no significant stenosis. Normal waveforms and color Doppler signal throughout. RIGHT VERTEBRAL ARTERY:  Normal flow direction and waveform. LEFT CAROTID ARTERY: Intimal thickening in the  common carotid artery and bulb. No focal plaque accumulation or stenosis. Normal waveforms and color Doppler signal throughout. LEFT VERTEBRAL ARTERY:  Normal flow direction and waveform. IMPRESSION: 1. Mild proximal right ICA plaque resulting in less than 50% diameter stenosis. 2.  Antegrade bilateral vertebral arterial flow. Electronically Signed   By: Corlis Leak M.D.   On: 07/10/2020 10:00     Assessment/Plan:   69 y.o. male with medical history significant for DM, HTN, glaucoma related blindness, morbid obesity who presents to the emergency room with left leg weakness  Patient stated that he noticed weakness in his left lower extremity while he was looking at TV that persisted when he went to bed at around 2100. Now improved but still not at baseline  - Pt has R frontal stroke in setting R m1 stenosis and decreased flow in the distal r MCA - agree with dual anti platelet therapy of ASA 81mg  and Plavix 75mg  daily for at least 90 days. - appreciate pt/ot - will likely need h ome health PT - stroke work up pending - potential d/c planning tomorrow.   07/10/2020, 12:00 PM

## 2020-07-11 DIAGNOSIS — I10 Essential (primary) hypertension: Secondary | ICD-10-CM | POA: Diagnosis not present

## 2020-07-11 DIAGNOSIS — I639 Cerebral infarction, unspecified: Secondary | ICD-10-CM | POA: Diagnosis not present

## 2020-07-11 DIAGNOSIS — Z6841 Body Mass Index (BMI) 40.0 and over, adult: Secondary | ICD-10-CM | POA: Diagnosis not present

## 2020-07-11 LAB — GLUCOSE, CAPILLARY
Glucose-Capillary: 127 mg/dL — ABNORMAL HIGH (ref 70–99)
Glucose-Capillary: 163 mg/dL — ABNORMAL HIGH (ref 70–99)

## 2020-07-11 MED ORDER — BRIMONIDINE TARTRATE 0.2 % OP SOLN
1.0000 [drp] | Freq: Three times a day (TID) | OPHTHALMIC | Status: DC
  Administered 2020-07-11: 1 [drp] via OPHTHALMIC
  Filled 2020-07-11 (×2): qty 5

## 2020-07-11 MED ORDER — LATANOPROST 0.005 % OP SOLN
1.0000 [drp] | Freq: Every day | OPHTHALMIC | Status: DC
  Filled 2020-07-11: qty 2.5

## 2020-07-11 MED ORDER — CLOPIDOGREL BISULFATE 75 MG PO TABS: 75.0000 mg | ORAL_TABLET | Freq: Every day | ORAL | 0 refills | Status: AC

## 2020-07-11 MED ORDER — DORZOLAMIDE HCL-TIMOLOL MAL 2-0.5 % OP SOLN
1.0000 [drp] | Freq: Two times a day (BID) | OPHTHALMIC | Status: DC
  Administered 2020-07-11: 1 [drp] via OPHTHALMIC
  Filled 2020-07-11: qty 10

## 2020-07-11 MED ORDER — ATORVASTATIN CALCIUM 40 MG PO TABS: 40.0000 mg | ORAL_TABLET | Freq: Every day | ORAL | 0 refills | Status: AC

## 2020-07-11 NOTE — Plan of Care (Signed)
  Problem: Education: Goal: Knowledge of General Education information will improve Description: Including pain rating scale, medication(s)/side effects and non-pharmacologic comfort measures Outcome: Progressing   Problem: Health Behavior/Discharge Planning: Goal: Ability to manage health-related needs will improve Outcome: Progressing   Problem: Clinical Measurements: Goal: Ability to maintain clinical measurements within normal limits will improve Outcome: Progressing Goal: Will remain free from infection Outcome: Progressing Goal: Diagnostic test results will improve Outcome: Progressing Goal: Respiratory complications will improve Outcome: Progressing Goal: Cardiovascular complication will be avoided Outcome: Progressing   Problem: Activity: Goal: Risk for activity intolerance will decrease Outcome: Progressing   Problem: Nutrition: Goal: Adequate nutrition will be maintained Outcome: Progressing   Problem: Coping: Goal: Level of anxiety will decrease Outcome: Progressing   Problem: Elimination: Goal: Will not experience complications related to bowel motility Outcome: Progressing Goal: Will not experience complications related to urinary retention Outcome: Progressing   Problem: Pain Managment: Goal: General experience of comfort will improve Outcome: Progressing   Problem: Safety: Goal: Ability to remain free from injury will improve Outcome: Progressing   Problem: Skin Integrity: Goal: Risk for impaired skin integrity will decrease Outcome: Progressing   Problem: Education: Goal: Knowledge of disease or condition will improve Outcome: Progressing Goal: Knowledge of secondary prevention will improve Outcome: Progressing Goal: Knowledge of patient specific risk factors addressed and post discharge goals established will improve Outcome: Progressing   Problem: Coping: Goal: Will verbalize positive feelings about self Outcome: Progressing Goal: Will  identify appropriate support needs Outcome: Progressing   Problem: Health Behavior/Discharge Planning: Goal: Ability to manage health-related needs will improve Outcome: Progressing   Problem: Self-Care: Goal: Ability to participate in self-care as condition permits will improve Outcome: Progressing Goal: Verbalization of feelings and concerns over difficulty with self-care will improve Outcome: Progressing Goal: Ability to communicate needs accurately will improve Outcome: Progressing   Problem: Nutrition: Goal: Risk of aspiration will decrease Outcome: Progressing   Problem: Ischemic Stroke/TIA Tissue Perfusion: Goal: Complications of ischemic stroke/TIA will be minimized Outcome: Progressing   

## 2020-07-11 NOTE — Progress Notes (Signed)
Physical Therapy Treatment Patient Details Name: Roger Gilmore MRN: [id number] DOB: 04/17/1951 Today's Date: 07/11/2020    History of Present Illness Per MD note:Roger Gilmore is a 69 y.o. male with medical history significant for DM, HTN, glaucoma related blindness, morbid obesity who presents to the emergency room with left-sided weakness.  Patient stated that he noticed weakness in his left upper extremity while he was looking at TV that persisted when he went to bed at around 2100.  At around midnight, when he awoke to go to the bathroom he felt like his left leg was heavy and noticed weakness in his left arm.  He had no weakness in the face, slurring of the speech, and had no headache, nausea or vomiting his girlfriend called EMS.  She states that he was unable to stand up without assistance and she helped him get dressed    PT Comments    Pt was pleasant and motivated to participate during the session and overall performed very well.  Pt was SBA with all functional tasks and demonstrated good stability and control with transfers and gait.  Pt ambulated initially with a RW but requested attempt without an AD and was equally steady without the walker.  Pt ambulated with a very slow, cautious cadence secondary to visual impairments and required frequent cuing to avoid obstacles but was steady throughout.  Pt reported feeling as if he is back to his physical baseline at this time.  Of note, pt stated that he does not need to go up the 14 steps to his bedroom secondary to having a bedroom on the first floor.  Pt stated would stay on the first floor until he felt safe to go upstairs.  Pt will benefit from HHPT services to safely address deficits listed in patient problem list for decreased caregiver assistance and for safe transition back to home setting although pt declining HHPT at this time.       Follow Up Recommendations  Home health PT;Other (comment) (Pt declined HHPT services)      Equipment Recommendations  Other (comment) (Pt declined RW)    Recommendations for Other Services       Precautions / Restrictions Precautions Precautions: Fall Precaution Comments: Blind Restrictions Weight Bearing Restrictions: No    Mobility  Bed Mobility Overal bed mobility: Needs Assistance Bed Mobility: Supine to Sit;Sit to Supine     Supine to sit: Supervision Sit to supine: Supervision   General bed mobility comments: Min extra time and effort but no physical assist required  Transfers Overall transfer level: Needs assistance Equipment used: None;Rolling walker (2 wheeled) Transfers: Sit to/from Stand Sit to Stand: Supervision         General transfer comment: Cues for sequencing  Ambulation/Gait Ambulation/Gait assistance: Supervision Gait Distance (Feet): 80 Feet x 1 with a RW and 80 Feet x 1 without an AD Assistive device: Rolling walker (2 wheeled);None Gait Pattern/deviations: Step-through pattern;Decreased step length - right;Decreased step length - left Gait velocity: decreased   General Gait Details: Slow, cautious cadence secondary to visual impairments with frequent verbal and tactile cues to assist with guiding the patient   Stairs             Wheelchair Mobility    Modified Rankin (Stroke Patients Only)       Balance Overall balance assessment: No apparent balance deficits (not formally assessed)  Cognition Arousal/Alertness: Awake/alert Behavior During Therapy: WFL for tasks assessed/performed Overall Cognitive Status: Within Functional Limits for tasks assessed                                        Exercises Total Joint Exercises Ankle Circles/Pumps: AROM;Strengthening;Both;10 reps Quad Sets: Strengthening;Both;10 reps Gluteal Sets: Strengthening;Both;10 reps Heel Slides: Strengthening;Both;10 reps Long Arc Quad: AROM;Strengthening;Both;10  reps Other Exercises Other Exercises: HEP education for BLE APs, QS, and GS x 10 each every 1-2 hours    General Comments        Pertinent Vitals/Pain Pain Assessment: No/denies pain    Home Living                      Prior Function            PT Goals (current goals can now be found in the care plan section) Progress towards PT goals: Progressing toward goals    Frequency    7X/week      PT Plan Current plan remains appropriate    Co-evaluation              AM-PAC PT "6 Clicks" Mobility   Outcome Measure  Help needed turning from your back to your side while in a flat bed without using bedrails?: None Help needed moving from lying on your back to sitting on the side of a flat bed without using bedrails?: A Little Help needed moving to and from a bed to a chair (including a wheelchair)?: A Little Help needed standing up from a chair using your arms (e.g., wheelchair or bedside chair)?: A Little Help needed to walk in hospital room?: A Little Help needed climbing 3-5 steps with a railing? : A Little 6 Click Score: 19    End of Session Equipment Utilized During Treatment: Gait belt Activity Tolerance: Patient tolerated treatment well Patient left: in bed;with call bell/phone within reach;with bed alarm set;Other (comment) (Pt declined up in chair) Nurse Communication: Mobility status PT Visit Diagnosis: Unsteadiness on feet (R26.81);Muscle weakness (generalized) (M62.81);Difficulty in walking, not elsewhere classified (R26.2)     Time: 4098-1191 PT Time Calculation (min) (ACUTE ONLY): 23 min  Charges:  $Gait Training: 8-22 mins $Therapeutic Exercise: 8-22 mins                     D. Scott Kia Varnadore PT, DPT 07/11/20, 11:57 AM

## 2020-07-11 NOTE — Progress Notes (Signed)
Subjective: No significant change overnight.   HPI: Roger Gilmore is an 69 y.o. male with medical history significant for DM, HTN, glaucoma related blindness, morbid obesity who presents to the emergency room with left leg weakness  Patient stated that he noticed weakness in his left lower extremity while he was looking at TV that persisted when he went to bed at around 2100. Now improved but still not at baseline  Past Medical History:  Diagnosis Date  . Blind in both eyes   . Diabetes mellitus without complication (HCC)    pre-diabetes on metformin  . Glaucoma   . Obesity     Past Surgical History:  Procedure Laterality Date  . EYE SURGERY    . PHOTOCOAGULATION WITH LASER Left 02/09/2016   Procedure: PHOTOCOAGULATION WITH LASER;  Surgeon: Sherald Hess, MD;  Location: [facility];  Service: Ophthalmology;  Laterality: Left;  oral meds-prediabetes  . TONSILLECTOMY      Family History  Problem Relation Age of Onset  . Stroke Mother     Social History:  reports that he has been smoking cigarettes. He has a 11.25 pack-year smoking history. He has never used smokeless tobacco. He reports current alcohol use. He reports that he does not use drugs.  No Known Allergies  Medications: I have reviewed the patient's current medications.   Physical Examination: Blood pressure 107/73, pulse 60, temperature 97.9 F (36.6 C), temperature source Oral, resp. rate 14, height 5\' 6"  (1.676 m), weight (!) 141.3 kg, SpO2 98 %.   Neurological Examination   Mental Status: Alert, oriented, thought content appropriate.  Speech fluent without evidence of aphasia.  Able to follow 3 step commands without difficulty. Cranial Nerves: II: Blind from glaucoma V,VII: smile symmetric, facial light touch sensation normal bilaterally VIII: hearing normal bilaterally IX,X: gag reflex present XI: bilateral shoulder shrug XII: midline tongue extension Motor: Right : Upper extremity    5/5    Left:     Upper extremity   5/5  Lower extremity   5/5     Lower extremity   3/5 Tone and bulk:normal tone throughout; no atrophy noted Sensory: Pinprick and light touch intact throughout, bilaterally    Laboratory Studies:   Basic Metabolic Panel: Recent Labs  Lab 07/10/20 0210  NA 139  K 3.6  CL 104  CO2 23  GLUCOSE 109*  BUN 15  CREATININE 1.31*  CALCIUM 8.8*    Liver Function Tests: Recent Labs  Lab 07/10/20 0210  AST 16  ALT 17  ALKPHOS 53  BILITOT 0.7  PROT 6.8  ALBUMIN 3.7   No results for input(s): LIPASE, AMYLASE in the last 168 hours. No results for input(s): AMMONIA in the last 168 hours.  CBC: Recent Labs  Lab 07/10/20 0210  WBC 5.5  NEUTROABS 2.4  HGB 14.2  HCT 43.3  MCV 86.9  PLT 193    Cardiac Enzymes: No results for input(s): CKTOTAL, CKMB, CKMBINDEX, TROPONINI in the last 168 hours.  BNP: Invalid input(s): POCBNP  CBG: Recent Labs  Lab 07/10/20 0822 07/10/20 1156 07/10/20 1621 07/10/20 2040 07/11/20 0844  GLUCAP 107* 173* 147* 169* 127*    Microbiology: Results for orders placed or performed during the hospital encounter of 07/10/20  Resp Panel by RT-PCR (Flu A&B, Covid) Nasopharyngeal Swab     Status: None   Collection Time: 07/10/20  2:10 AM   Specimen: Nasopharyngeal Swab; Nasopharyngeal(NP) swabs in vial transport medium  Result Value Ref Range Status   SARS  Coronavirus 2 by RT PCR NEGATIVE NEGATIVE Final    Comment: (NOTE) SARS-CoV-2 target nucleic acids are NOT DETECTED.  The SARS-CoV-2 RNA is generally detectable in upper respiratory specimens during the acute phase of infection. The lowest concentration of SARS-CoV-2 viral copies this assay can detect is 138 copies/mL. A negative result does not preclude SARS-Cov-2 infection and should not be used as the sole basis for treatment or other patient management decisions. A negative result may occur with  improper specimen collection/handling, submission of  specimen other than nasopharyngeal swab, presence of viral mutation(s) within the areas targeted by this assay, and inadequate number of viral copies(<138 copies/mL). A negative result must be combined with clinical observations, patient history, and epidemiological information. The expected result is Negative.  Fact Sheet for Patients:  BloggerCourse.com  Fact Sheet for Healthcare Providers:  SeriousBroker.it  This test is no t yet approved or cleared by the [place] FDA and  has been authorized for detection and/or diagnosis of SARS-CoV-2 by FDA under an Emergency Use Authorization (EUA). This EUA will remain  in effect (meaning this test can be used) for the duration of the COVID-19 declaration under Section 564(b)(1) of the Act, 21 U.S.C.section 360bbb-3(b)(1), unless the authorization is terminated  or revoked sooner.       Influenza A by PCR NEGATIVE NEGATIVE Final   Influenza B by PCR NEGATIVE NEGATIVE Final    Comment: (NOTE) The Xpert Xpress SARS-CoV-2/FLU/RSV plus assay is intended as an aid in the diagnosis of influenza from Nasopharyngeal swab specimens and should not be used as a sole basis for treatment. Nasal washings and aspirates are unacceptable for Xpert Xpress SARS-CoV-2/FLU/RSV testing.  Fact Sheet for Patients: BloggerCourse.com  Fact Sheet for Healthcare Providers: SeriousBroker.it  This test is not yet approved or cleared by the [place] FDA and has been authorized for detection and/or diagnosis of SARS-CoV-2 by FDA under an Emergency Use Authorization (EUA). This EUA will remain in effect (meaning this test can be used) for the duration of the COVID-19 declaration under Section 564(b)(1) of the Act, 21 U.S.C. section 360bbb-3(b)(1), unless the authorization is terminated or revoked.  Performed at [facility], [address] [place], [place] [address]     Coagulation Studies: Recent Labs    07/10/20 0210  LABPROT 13.2  INR 1.0    Urinalysis: No results for input(s): COLORURINE, LABSPEC, PHURINE, GLUCOSEU, HGBUR, BILIRUBINUR, KETONESUR, PROTEINUR, UROBILINOGEN, NITRITE, LEUKOCYTESUR in the last 168 hours.  Invalid input(s): APPERANCEUR  Lipid Panel:     Component Value Date/Time   CHOL 167 07/10/2020 0451   TRIG 111 07/10/2020 0451   HDL 45 07/10/2020 0451   CHOLHDL 3.7 07/10/2020 0451   VLDL 22 07/10/2020 0451   LDLCALC 100 (H) 07/10/2020 0451    HgbA1C:  Lab Results  Component Value Date   HGBA1C 7.2 (H) 07/10/2020    Urine Drug Screen:  No results found for: LABOPIA, COCAINSCRNUR, LABBENZ, AMPHETMU, THCU, LABBARB  Alcohol Level:  Recent Labs  Lab 07/10/20 0210  ETH <10    Other results: EKG: normal EKG, normal sinus rhythm, unchanged from previous tracings.  Imaging: CT HEAD WO CONTRAST  Result Date: 07/10/2020 CLINICAL DATA:  Left-sided weakness. Symptoms began at 2100 hours. History of diabetes. EXAM: CT HEAD WITHOUT CONTRAST TECHNIQUE: Contiguous axial images were obtained from the base of the skull through the vertex without intravenous contrast. COMPARISON:  None. FINDINGS: Brain: Mild cerebral atrophy. Mild ventricular dilatation consistent with central atrophy. Prominent low-attenuation  changes throughout the deep white matter bilaterally with symmetrical distribution. These are likely small vessel ischemic changes. No definite finding of acute ischemia. No mass-effect or midline shift. No abnormal extra-axial fluid collections. Gray-white matter junctions are distinct. Basal cisterns are not effaced. No acute intracranial hemorrhage. Vascular: No hyperdense vessel or unexpected calcification. Skull: Calvarium appears intact. Sinuses/Orbits: Postoperative changes in the right globe. Old right medial orbital wall fracture deformity. Paranasal sinuses and mastoid air cells are  clear. Other: None. IMPRESSION: 1. No acute intracranial abnormalities. 2. Chronic atrophy and small vessel ischemic changes. Electronically Signed   By: Burman Nieves M.D.   On: 07/10/2020 02:44   MR ANGIO HEAD WO CONTRAST  Addendum Date: 07/10/2020   ADDENDUM REPORT: 07/10/2020 10:31 ADDENDUM: These results were called by telephone at the time of interpretation on 07/10/2020 at 10:19 am to provider Dr. Rhona Leavens, Who verbally acknowledged these results. Electronically Signed   By: Stana Bunting M.D.   On: 07/10/2020 10:31   Result Date: 07/10/2020 CLINICAL DATA:  Neuro deficit, acute, stroke suspected, left lower extremity weakness. EXAM: MRI HEAD WITHOUT CONTRAST MRA HEAD WITHOUT CONTRAST TECHNIQUE: Multiplanar, multiecho pulse sequences of the brain and surrounding structures were obtained without intravenous contrast. Angiographic images of the head were obtained using MRA technique without contrast. COMPARISON:  07/10/2020 head CT. FINDINGS: MRI HEAD FINDINGS Brain: Multifocal restricted diffusion involving the right frontal lobe. No intracranial hemorrhage. No midline shift, ventriculomegaly or extra-axial fluid collection. No mass lesion. Mild cerebral atrophy with ex vacuo dilatation. Chronic right centrum semiovale lacunar insult. Advanced chronic microvascular ischemic changes. Vascular: Please see MRA. Skull and upper cervical spine: Normal marrow signal. Sinuses/Orbits: Postprocedural appearance of the right orbit. Minimal left maxillary sinus mucosal thickening. Clear mastoid air cells. Other: Please note that image quality is mildly degraded by motion artifact. MRA HEAD FINDINGS Anterior circulation: Patent ICAs. Moderate narrowing of the proximal right M1 segment. Distal right MCA branches demonstrate asymmetrically diminished opacification. Patent ACAs. No aneurysm. Posterior circulation: Dominant left vertebral artery. Patent V4 segments and proximal PICA. Patent basilar, superior  cerebellar and posterior cerebral arteries. Fetal origin of the left PCA. No aneurysm. Venous sinuses: No evidence of thrombosis. Anatomic variants: Discussed above. IMPRESSION: Multifocal acute right frontal infarcts. Chronic right centrum semiovale lacunar insult. Mild cerebral atrophy. Advanced chronic microvascular ischemic changes. Moderate right M1 segment narrowing with asymmetrically diminished opacification of right MCA distal branches. Electronically Signed: By: Stana Bunting M.D. On: 07/10/2020 10:17   MR BRAIN WO CONTRAST  Addendum Date: 07/10/2020   ADDENDUM REPORT: 07/10/2020 10:31 ADDENDUM: These results were called by telephone at the time of interpretation on 07/10/2020 at 10:19 am to provider Dr. Rhona Leavens, Who verbally acknowledged these results. Electronically Signed   By: Stana Bunting M.D.   On: 07/10/2020 10:31   Result Date: 07/10/2020 CLINICAL DATA:  Neuro deficit, acute, stroke suspected, left lower extremity weakness. EXAM: MRI HEAD WITHOUT CONTRAST MRA HEAD WITHOUT CONTRAST TECHNIQUE: Multiplanar, multiecho pulse sequences of the brain and surrounding structures were obtained without intravenous contrast. Angiographic images of the head were obtained using MRA technique without contrast. COMPARISON:  07/10/2020 head CT. FINDINGS: MRI HEAD FINDINGS Brain: Multifocal restricted diffusion involving the right frontal lobe. No intracranial hemorrhage. No midline shift, ventriculomegaly or extra-axial fluid collection. No mass lesion. Mild cerebral atrophy with ex vacuo dilatation. Chronic right centrum semiovale lacunar insult. Advanced chronic microvascular ischemic changes. Vascular: Please see MRA. Skull and upper cervical spine: Normal marrow signal. Sinuses/Orbits: Postprocedural appearance of the  right orbit. Minimal left maxillary sinus mucosal thickening. Clear mastoid air cells. Other: Please note that image quality is mildly degraded by motion artifact. MRA HEAD  FINDINGS Anterior circulation: Patent ICAs. Moderate narrowing of the proximal right M1 segment. Distal right MCA branches demonstrate asymmetrically diminished opacification. Patent ACAs. No aneurysm. Posterior circulation: Dominant left vertebral artery. Patent V4 segments and proximal PICA. Patent basilar, superior cerebellar and posterior cerebral arteries. Fetal origin of the left PCA. No aneurysm. Venous sinuses: No evidence of thrombosis. Anatomic variants: Discussed above. IMPRESSION: Multifocal acute right frontal infarcts. Chronic right centrum semiovale lacunar insult. Mild cerebral atrophy. Advanced chronic microvascular ischemic changes. Moderate right M1 segment narrowing with asymmetrically diminished opacification of right MCA distal branches. Electronically Signed: By: Stana Buntinghikanele  Emekauwa M.D. On: 07/10/2020 10:17   US Carotid Bilateral  Result Date: 07/10/2020 CLINICAL DATA:  Stroke. Hypertension, diabetes, previous tobacco abuse EXAM: BILATERAL CAROTID DUPLEX ULTRASOUND TECHNIQUE: Wallace CullensGray scale imaging, color Doppler and duplex ultrasound were performed of bilateral carotid and vertebral arteries in the neck. COMPARISON:  None. FINDINGS: Criteria: Quantification of carotid stenosis is based on velocity parameters that correlate the residual internal carotid diameter with NASCET-based stenosis levels, using the diameter of the distal internal carotid lumen as the denominator for stenosis measurement. The following velocity measurements were obtained: RIGHT ICA: 75/26 cm/sec CCA: 61/12 cm/sec SYSTOLIC ICA/CCA RATIO:  1.5 ECA: 67 cm/sec LEFT ICA: 51/16 cm/sec CCA: 69/12 cm/sec SYSTOLIC ICA/CCA RATIO:  0.9 ECA: 67 cm/sec RIGHT CAROTID ARTERY: Intimal thickening in the common carotid artery. Eccentric smooth noncalcified plaque in the proximal ICA with no significant stenosis. Normal waveforms and color Doppler signal throughout. RIGHT VERTEBRAL ARTERY:  Normal flow direction and waveform. LEFT  CAROTID ARTERY: Intimal thickening in the common carotid artery and bulb. No focal plaque accumulation or stenosis. Normal waveforms and color Doppler signal throughout. LEFT VERTEBRAL ARTERY:  Normal flow direction and waveform. IMPRESSION: 1. Mild proximal right ICA plaque resulting in less than 50% diameter stenosis. 2.  Antegrade bilateral vertebral arterial flow. Electronically Signed   By: Corlis Leak  Hassell M.D.   On: 07/10/2020 10:00   ECHOCARDIOGRAM COMPLETE  Result Date: 07/10/2020    ECHOCARDIOGRAM REPORT   Patient Name:   Mena PaulsJOHN V Gilmore Date of Exam: 07/10/2020 Medical Rec #:  [id number]      Height:       66.0 in Accession #:    [id number]     Weight:       312.0 lb Date of Birth:  17-Oct-1950     BSA:          2.416 m Patient Age:    69 years       BP:           144/77 mmHg Patient Gender: M              HR:           63 bpm. Exam Location:  [facility] Procedure: 2D Echo, Color Doppler, Cardiac Doppler and Intracardiac            Opacification Agent Indications:     I163.9 Stroke  History:         Patient has no prior history of Echocardiogram examinations.                  Risk Factors:Diabetes.  Sonographer:     Humphrey RollsJoan Heiss RDCS (AE) Referring Phys:  [id number] Andris BaumannHAZEL V DUNCAN Diagnosing Phys: Harold HedgeKenneth Fath MD  Sonographer Comments: Suboptimal apical window and  no subcostal window. Image acquisition challenging due to patient body habitus. IMPRESSIONS  1. Left ventricular ejection fraction, by estimation, is 55 to 60%. The left ventricle has normal function. The left ventricle has no regional wall motion abnormalities. Left ventricular diastolic parameters were normal.  2. Right ventricular systolic function is normal. The right ventricular size is normal.  3. Left atrial size was mildly dilated.  4. Right atrial size was mildly dilated.  5. The mitral valve is grossly normal. Trivial mitral valve regurgitation.  6. The aortic valve is grossly normal. Aortic valve regurgitation is not visualized. FINDINGS  Left  Ventricle: Left ventricular ejection fraction, by estimation, is 55 to 60%. The left ventricle has normal function. The left ventricle has no regional wall motion abnormalities. Definity contrast agent was given IV to delineate the left ventricular  endocardial borders. The left ventricular internal cavity size was normal in size. There is no left ventricular hypertrophy. Left ventricular diastolic parameters were normal. Right Ventricle: The right ventricular size is normal. No increase in right ventricular wall thickness. Right ventricular systolic function is normal. Left Atrium: Left atrial size was mildly dilated. Right Atrium: Right atrial size was mildly dilated. Pericardium: There is no evidence of pericardial effusion. Mitral Valve: The mitral valve is grossly normal. Trivial mitral valve regurgitation. MV peak gradient, 3.0 mmHg. The mean mitral valve gradient is 1.0 mmHg. Tricuspid Valve: The tricuspid valve is grossly normal. Tricuspid valve regurgitation is trivial. Aortic Valve: The aortic valve is grossly normal. Aortic valve regurgitation is not visualized. Aortic valve mean gradient measures 1.0 mmHg. Aortic valve peak gradient measures 2.4 mmHg. Aortic valve area, by VTI measures 2.90 cm. Pulmonic Valve: The pulmonic valve was not well visualized. Pulmonic valve regurgitation is not visualized. Aorta: The aortic root is normal in size and structure. IAS/Shunts: The interatrial septum was not assessed.  LEFT VENTRICLE PLAX 2D LVIDd:         4.44 cm  Diastology LVIDs:         3.12 cm  LV e' medial:    7.72 cm/s LV PW:         1.56 cm  LV E/e' medial:  7.9 LV IVS:        1.28 cm  LV e' lateral:   9.90 cm/s LVOT diam:     2.40 cm  LV E/e' lateral: 6.1 LV SV:         48 LV SV Index:   20 LVOT Area:     4.52 cm  LEFT ATRIUM           Index LA diam:      4.50 cm 1.86 cm/m LA Vol (A2C): 36.2 ml 14.98 ml/m  AORTIC VALVE                   PULMONIC VALVE AV Area (Vmax):    3.68 cm    PV Vmax:       0.90  m/s AV Area (Vmean):   3.50 cm    PV Vmean:      64.600 cm/s AV Area (VTI):     2.90 cm    PV VTI:        0.184 m AV Vmax:           77.10 cm/s  PV Peak grad:  3.3 mmHg AV Vmean:          56.300 cm/s PV Mean grad:  2.0 mmHg AV VTI:  0.164 m AV Peak Grad:      2.4 mmHg AV Mean Grad:      1.0 mmHg LVOT Vmax:         62.70 cm/s LVOT Vmean:        43.500 cm/s LVOT VTI:          0.105 m LVOT/AV VTI ratio: 0.64  AORTA Ao Root diam: 3.30 cm MITRAL VALVE MV Area (PHT): 3.10 cm    SHUNTS MV Peak grad:  3.0 mmHg    Systemic VTI:  0.10 m MV Mean grad:  1.0 mmHg    Systemic Diam: 2.40 cm MV Vmax:       0.86 m/s MV Vmean:      53.4 cm/s MV Decel Time: 245 msec MV E velocity: 60.80 cm/s MV A velocity: 81.40 cm/s MV E/A ratio:  0.75 Harold Hedge MD Electronically signed by Harold Hedge MD Signature Date/Time: 07/10/2020/4:08:25 PM    Final      Assessment/Plan:   69 y.o. male with medical history significant for DM, HTN, glaucoma related blindness, morbid obesity who presents to the emergency room with left leg weakness  Patient stated that he noticed weakness in his left lower extremity while he was looking at TV that persisted when he went to bed at around 2100. Now improved but still not at baseline  - Pt has R frontal stroke in setting R m1 stenosis and decreased flow in the distal r MCA - agree with dual anti platelet therapy of ASA 81mg  and Plavix 75mg  daily for at least 90 days. - appreciate pt/ot - will likely need h ome health PT - will sign off for now - please call with questions.    07/11/2020, 11:52 AM

## 2020-07-11 NOTE — Discharge Summary (Signed)
Physician Discharge Summary  Roger Gilmore IHK:[id number] DOB: 05-19-1951 DOA: 07/10/2020  PCP: Gracelyn Nurse, MD  Admit date: 07/10/2020 Discharge date: 07/11/2020  Admitted From: Home Disposition:  Home  Recommendations for Outpatient Follow-up:  1. Follow up with PCP in 1-2 weeks 2. Follow up with Neurology in 4 weeks  Discharge Condition:Stable CODE STATUS:Full Diet recommendation: Diabetic, heart healthy   Brief/Interim Summary: 69 y.o.malewith medical history significant forDM, HTN,glaucoma related blindness, morbid obesity who presents to the emergency room with left-sided weakness. Patient stated that he noticed weakness in his left upper extremity while he was looking at TV that persisted when he went to bed at around 2100. At around [facility] he awoke to go to the bathroom he felt like his left leg was heavy and noticed weakness in his left arm. He had no weakness in the face, slurring of the speech, and had no headache, nausea or vomiting his girlfriend called EMS.She states that he was unable to stand up without assistance and she helped him get dressed ED Course:Patient arrived in the ER at 2 AM and does not appear to have arrived as code stroke. On arrival in the emergency room, patient awake and alert, still weak on left side but stating symptoms.NIHSS reported by the ERof 2. BP 130/78. Vitals otherwise within normal limits. Blood work unremarkable except for creatinine of 1.31 but no recent baseline available CT head shows no acute intracranial findings. Pt was admitted for stroke work up  Discharge Diagnoses:  Principal Problem:   Acute CVA (cerebrovascular accident) (HCC) Active Problems:   Blindness   DM type 2 (diabetes mellitus, type 2) (HCC)   Hypertension   Morbid obesity with BMI of 50.0-59.9, adult (HCC)  Acute CVA (cerebrovascular accident) (HCC) -Patient presented with left hemiparesis, presenting outside TPA window -Head CT  negative -Follow-up MRI/MRA reviewed confirmed multifocal acute R frontal infarcts -Carotid dopplers reveal <50 stenosis involving R ICA -2d echo performed, reviewed. Findings of normal LVEF with no wall motion abnormality -Neurology consulted, recommending dual antiplatelet tx with ASA and plavix x at least 90 days -PT/OT consulted, recs for HHPT, however, pt reportedly declined HH services -A1c reviewed, noted to be 7.2 -Recommend close outpt follow up with Neurology  DM type 2 (diabetes mellitus, type 2) (HCC) -Continue with sliding scale insulin while in hospital -Glucose trends stable -Resume home meds on d/c  Hypertension -Holding home meds for permissive hypertension -BP remained stable at present while off BP meds -Given soft bp in the setting of CVA, will recommend patient to continue holding bp meds and defer to PCP as to when/if pt should resume bp regimen  Morbid obesity with BMI of 50.0-59.9, adult (HCC) -Recommend diet/lifestyle modification  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   aspirin 81 MG chewable tablet Chew 81 mg by mouth daily. am   atorvastatin 40 MG tablet Commonly known as: LIPITOR Take 1 tablet (40 mg total) by mouth daily at 6 PM.   brimonidine 0.2 % ophthalmic solution Commonly known as: ALPHAGAN Apply 1 drop to eye in the morning, at  noon, and at bedtime.   clopidogrel 75 MG tablet Commonly known as: PLAVIX Take 1 tablet (75 mg total) by mouth daily. Start taking on: July 12, 2020   dorzolamide-timolol 22.3-6.8 MG/ML ophthalmic  solution Commonly known as: COSOPT INSTILL 1 DROP INTO BOTH EYES TWICE A DAY   glimepiride 4 MG tablet Commonly known as: AMARYL Take 4 mg by mouth daily.   latanoprost 0.005 % ophthalmic solution Commonly known as: XALATAN Place 1 drop into both eyes at bedtime.   metFORMIN 850 MG tablet Commonly known as: GLUCOPHAGE Take 850 mg by mouth 2 (two) times daily.       Follow-up Information    Gracelyn Nurse, MD. Schedule an appointment as soon as possible for a visit in 2 week(s).   Specialty: Internal Medicine Contact information: [address] [place] [address] [phone]        Lonell Face, MD. Schedule an appointment as soon as possible for a visit in 4 week(s).   Specialty: Neurology Contact information: [address] [facility] [place] [address] [phone]              No Known Allergies  Consultations:  Neurology  Procedures/Studies: CT HEAD WO CONTRAST  Result Date: 07/10/2020 CLINICAL DATA:  Left-sided weakness. Symptoms began at 2100 hours. History of diabetes. EXAM: CT HEAD WITHOUT CONTRAST TECHNIQUE: Contiguous axial images were obtained from the base of the skull through the vertex without intravenous contrast. COMPARISON:  None. FINDINGS: Brain: Mild cerebral atrophy. Mild ventricular dilatation consistent with central atrophy. Prominent low-attenuation changes throughout the deep white matter bilaterally with symmetrical distribution. These are likely small vessel ischemic changes. No definite finding of acute ischemia. No mass-effect or midline shift. No abnormal extra-axial fluid collections. Gray-white matter junctions are distinct. Basal cisterns are not effaced. No acute intracranial hemorrhage. Vascular: No hyperdense vessel or unexpected calcification. Skull: Calvarium appears intact. Sinuses/Orbits: Postoperative changes in the right globe. Old right medial orbital wall fracture deformity.  Paranasal sinuses and mastoid air cells are clear. Other: None. IMPRESSION: 1. No acute intracranial abnormalities. 2. Chronic atrophy and small vessel ischemic changes. Electronically Signed   By: Burman Nieves M.D.   On: 07/10/2020 02:44   MR ANGIO HEAD WO CONTRAST  Addendum Date: 07/10/2020   ADDENDUM REPORT: 07/10/2020 10:31 ADDENDUM: These results were called by telephone at the time of interpretation on 07/10/2020 at 10:19 am to provider Dr. Rhona Leavens, Who verbally acknowledged these results. Electronically Signed   By: Stana Bunting M.D.   On: 07/10/2020 10:31   Result Date: 07/10/2020 CLINICAL DATA:  Neuro deficit, acute, stroke suspected, left lower extremity weakness. EXAM: MRI HEAD WITHOUT CONTRAST MRA HEAD WITHOUT CONTRAST TECHNIQUE: Multiplanar, multiecho pulse sequences of the brain and surrounding structures were obtained without intravenous contrast. Angiographic images of the head were obtained using MRA technique without contrast. COMPARISON:  07/10/2020 head CT. FINDINGS: MRI HEAD FINDINGS Brain: Multifocal restricted diffusion involving the right frontal lobe. No intracranial hemorrhage. No midline shift, ventriculomegaly or extra-axial fluid collection. No mass lesion. Mild cerebral atrophy with ex vacuo dilatation. Chronic right centrum semiovale lacunar insult. Advanced chronic microvascular ischemic changes. Vascular: Please see MRA. Skull and upper cervical spine: Normal marrow signal. Sinuses/Orbits: Postprocedural appearance of the right orbit. Minimal left maxillary sinus mucosal thickening. Clear mastoid air cells. Other: Please note that image quality is mildly degraded by motion artifact. MRA HEAD FINDINGS Anterior circulation: Patent ICAs. Moderate narrowing of the proximal right M1 segment. Distal  right MCA branches demonstrate asymmetrically diminished opacification. Patent ACAs. No aneurysm. Posterior circulation: Dominant left vertebral artery. Patent V4 segments and  proximal PICA. Patent basilar, superior cerebellar and posterior cerebral arteries. Fetal origin of the left PCA. No aneurysm. Venous sinuses: No evidence of thrombosis. Anatomic variants: Discussed above. IMPRESSION: Multifocal acute right frontal infarcts. Chronic right centrum semiovale lacunar insult. Mild cerebral atrophy. Advanced chronic microvascular ischemic changes. Moderate right M1 segment narrowing with asymmetrically diminished opacification of right MCA distal branches. Electronically Signed: By: Stana Bunting M.D. On: 07/10/2020 10:17   MR BRAIN WO CONTRAST  Addendum Date: 07/10/2020   ADDENDUM REPORT: 07/10/2020 10:31 ADDENDUM: These results were called by telephone at the time of interpretation on 07/10/2020 at 10:19 am to provider Dr. Rhona Leavens, Who verbally acknowledged these results. Electronically Signed   By: Stana Bunting M.D.   On: 07/10/2020 10:31   Result Date: 07/10/2020 CLINICAL DATA:  Neuro deficit, acute, stroke suspected, left lower extremity weakness. EXAM: MRI HEAD WITHOUT CONTRAST MRA HEAD WITHOUT CONTRAST TECHNIQUE: Multiplanar, multiecho pulse sequences of the brain and surrounding structures were obtained without intravenous contrast. Angiographic images of the head were obtained using MRA technique without contrast. COMPARISON:  07/10/2020 head CT. FINDINGS: MRI HEAD FINDINGS Brain: Multifocal restricted diffusion involving the right frontal lobe. No intracranial hemorrhage. No midline shift, ventriculomegaly or extra-axial fluid collection. No mass lesion. Mild cerebral atrophy with ex vacuo dilatation. Chronic right centrum semiovale lacunar insult. Advanced chronic microvascular ischemic changes. Vascular: Please see MRA. Skull and upper cervical spine: Normal marrow signal. Sinuses/Orbits: Postprocedural appearance of the right orbit. Minimal left maxillary sinus mucosal thickening. Clear mastoid air cells. Other: Please note that image quality is mildly  degraded by motion artifact. MRA HEAD FINDINGS Anterior circulation: Patent ICAs. Moderate narrowing of the proximal right M1 segment. Distal right MCA branches demonstrate asymmetrically diminished opacification. Patent ACAs. No aneurysm. Posterior circulation: Dominant left vertebral artery. Patent V4 segments and proximal PICA. Patent basilar, superior cerebellar and posterior cerebral arteries. Fetal origin of the left PCA. No aneurysm. Venous sinuses: No evidence of thrombosis. Anatomic variants: Discussed above. IMPRESSION: Multifocal acute right frontal infarcts. Chronic right centrum semiovale lacunar insult. Mild cerebral atrophy. Advanced chronic microvascular ischemic changes. Moderate right M1 segment narrowing with asymmetrically diminished opacification of right MCA distal branches. Electronically Signed: By: Stana Bunting M.D. On: 07/10/2020 10:17   US Carotid Bilateral  Result Date: 07/10/2020 CLINICAL DATA:  Stroke. Hypertension, diabetes, previous tobacco abuse EXAM: BILATERAL CAROTID DUPLEX ULTRASOUND TECHNIQUE: Wallace Cullens scale imaging, color Doppler and duplex ultrasound were performed of bilateral carotid and vertebral arteries in the neck. COMPARISON:  None. FINDINGS: Criteria: Quantification of carotid stenosis is based on velocity parameters that correlate the residual internal carotid diameter with NASCET-based stenosis levels, using the diameter of the distal internal carotid lumen as the denominator for stenosis measurement. The following velocity measurements were obtained: RIGHT ICA: 75/26 cm/sec CCA: 61/12 cm/sec SYSTOLIC ICA/CCA RATIO:  1.5 ECA: 67 cm/sec LEFT ICA: 51/16 cm/sec CCA: 69/12 cm/sec SYSTOLIC ICA/CCA RATIO:  0.9 ECA: 67 cm/sec RIGHT CAROTID ARTERY: Intimal thickening in the common carotid artery. Eccentric smooth noncalcified plaque in the proximal ICA with no significant stenosis. Normal waveforms and color Doppler signal throughout. RIGHT VERTEBRAL ARTERY:  Normal  flow direction and waveform. LEFT CAROTID ARTERY: Intimal thickening in the common carotid artery and bulb. No focal plaque accumulation or stenosis. Normal waveforms and color Doppler signal throughout. LEFT VERTEBRAL ARTERY:  Normal flow direction and waveform. IMPRESSION: 1. Mild  proximal right ICA plaque resulting in less than 50% diameter stenosis. 2.  Antegrade bilateral vertebral arterial flow. Electronically Signed   By: Corlis Leak  Hassell M.D.   On: 07/10/2020 10:00   ECHOCARDIOGRAM COMPLETE  Result Date: 07/10/2020    ECHOCARDIOGRAM REPORT   Patient Name:   Roger Gilmore Date of Exam: 07/10/2020 Medical Rec #:  [id number]      Height:       66.0 in Accession #:    [id number]     Weight:       312.0 lb Date of Birth:  January 17, 1951     BSA:          2.416 m Patient Age:    69 years       BP:           144/77 mmHg Patient Gender: M              HR:           63 bpm. Exam Location:  [facility] Procedure: 2D Echo, Color Doppler, Cardiac Doppler and Intracardiac            Opacification Agent Indications:     I163.9 Stroke  History:         Patient has no prior history of Echocardiogram examinations.                  Risk Factors:Diabetes.  Sonographer:     Humphrey RollsJoan Heiss RDCS (AE) Referring Phys:  [id number] Andris BaumannHAZEL V DUNCAN Diagnosing Phys: Harold HedgeKenneth Fath MD  Sonographer Comments: Suboptimal apical window and no subcostal window. Image acquisition challenging due to patient body habitus. IMPRESSIONS  1. Left ventricular ejection fraction, by estimation, is 55 to 60%. The left ventricle has normal function. The left ventricle has no regional wall motion abnormalities. Left ventricular diastolic parameters were normal.  2. Right ventricular systolic function is normal. The right ventricular size is normal.  3. Left atrial size was mildly dilated.  4. Right atrial size was mildly dilated.  5. The mitral valve is grossly normal. Trivial mitral valve regurgitation.  6. The aortic valve is grossly normal. Aortic valve regurgitation is  not visualized. FINDINGS  Left Ventricle: Left ventricular ejection fraction, by estimation, is 55 to 60%. The left ventricle has normal function. The left ventricle has no regional wall motion abnormalities. Definity contrast agent was given IV to delineate the left ventricular  endocardial borders. The left ventricular internal cavity size was normal in size. There is no left ventricular hypertrophy. Left ventricular diastolic parameters were normal. Right Ventricle: The right ventricular size is normal. No increase in right ventricular wall thickness. Right ventricular systolic function is normal. Left Atrium: Left atrial size was mildly dilated. Right Atrium: Right atrial size was mildly dilated. Pericardium: There is no evidence of pericardial effusion. Mitral Valve: The mitral valve is grossly normal. Trivial mitral valve regurgitation. MV peak gradient, 3.0 mmHg. The mean mitral valve gradient is 1.0 mmHg. Tricuspid Valve: The tricuspid valve is grossly normal. Tricuspid valve regurgitation is trivial. Aortic Valve: The aortic valve is grossly normal. Aortic valve regurgitation is not visualized. Aortic valve mean gradient measures 1.0 mmHg. Aortic valve peak gradient measures 2.4 mmHg. Aortic valve area, by VTI measures 2.90 cm. Pulmonic Valve: The pulmonic valve was not well visualized. Pulmonic valve regurgitation is not visualized. Aorta: The aortic root is normal in size and structure. IAS/Shunts: The interatrial septum was not assessed.  LEFT VENTRICLE PLAX 2D LVIDd:  4.44 cm  Diastology LVIDs:         3.12 cm  LV e' medial:    7.72 cm/s LV PW:         1.56 cm  LV E/e' medial:  7.9 LV IVS:        1.28 cm  LV e' lateral:   9.90 cm/s LVOT diam:     2.40 cm  LV E/e' lateral: 6.1 LV SV:         48 LV SV Index:   20 LVOT Area:     4.52 cm  LEFT ATRIUM           Index LA diam:      4.50 cm 1.86 cm/m LA Vol (A2C): 36.2 ml 14.98 ml/m  AORTIC VALVE                   PULMONIC VALVE AV Area (Vmax):     3.68 cm    PV Vmax:       0.90 m/s AV Area (Vmean):   3.50 cm    PV Vmean:      64.600 cm/s AV Area (VTI):     2.90 cm    PV VTI:        0.184 m AV Vmax:           77.10 cm/s  PV Peak grad:  3.3 mmHg AV Vmean:          56.300 cm/s PV Mean grad:  2.0 mmHg AV VTI:            0.164 m AV Peak Grad:      2.4 mmHg AV Mean Grad:      1.0 mmHg LVOT Vmax:         62.70 cm/s LVOT Vmean:        43.500 cm/s LVOT VTI:          0.105 m LVOT/AV VTI ratio: 0.64  AORTA Ao Root diam: 3.30 cm MITRAL VALVE MV Area (PHT): 3.10 cm    SHUNTS MV Peak grad:  3.0 mmHg    Systemic VTI:  0.10 m MV Mean grad:  1.0 mmHg    Systemic Diam: 2.40 cm MV Vmax:       0.86 m/s MV Vmean:      53.4 cm/s MV Decel Time: 245 msec MV E velocity: 60.80 cm/s MV A velocity: 81.40 cm/s MV E/A ratio:  0.75 Harold Hedge MD Electronically signed by Harold Hedge MD Signature Date/Time: 07/10/2020/4:08:25 PM    Final      Subjective: Eager to go home today  Discharge Exam: Vitals:   07/11/20 0436 07/11/20 1203  BP: 107/73 120/67  Pulse: 60 (!) 55  Resp: 14 18  Temp: 97.9 F (36.6 C) 98 F (36.7 C)  SpO2: 98% 99%   Vitals:   07/10/20 1621 07/10/20 1942 07/11/20 0436 07/11/20 1203  BP: (!) 124/57 131/65 107/73 120/67  Pulse: 63 60 60 (!) 55  Resp: 19 17 14 18   Temp: 98.2 F (36.8 C) 98.6 F (37 C) 97.9 F (36.6 C) 98 F (36.7 C)  TempSrc:  Oral Oral Oral  SpO2: 99% 95% 98% 99%  Weight:   (!) 141.3 kg   Height:        General: Pt is alert, awake, not in acute distress Cardiovascular: RRR, S1/S2 +, no rubs, no gallops Respiratory: CTA bilaterally, no wheezing, no rhonchi Abdominal: Soft, NT, ND, bowel sounds + Extremities: no edema, no cyanosis  The results of significant diagnostics from this hospitalization (including imaging, microbiology, ancillary and laboratory) are listed below for reference.     Microbiology: Recent Results (from the past 240 hour(s))  Resp Panel by RT-PCR (Flu A&B, Covid) Nasopharyngeal Swab      Status: None   Collection Time: 07/10/20  2:10 AM   Specimen: Nasopharyngeal Swab; Nasopharyngeal(NP) swabs in vial transport medium  Result Value Ref Range Status   SARS Coronavirus 2 by RT PCR NEGATIVE NEGATIVE Final    Comment: (NOTE) SARS-CoV-2 target nucleic acids are NOT DETECTED.  The SARS-CoV-2 RNA is generally detectable in upper respiratory specimens during the acute phase of infection. The lowest concentration of SARS-CoV-2 viral copies this assay can detect is 138 copies/mL. A negative result does not preclude SARS-Cov-2 infection and should not be used as the sole basis for treatment or other patient management decisions. A negative result may occur with  improper specimen collection/handling, submission of specimen other than nasopharyngeal swab, presence of viral mutation(s) within the areas targeted by this assay, and inadequate number of viral copies(<138 copies/mL). A negative result must be combined with clinical observations, patient history, and epidemiological information. The expected result is Negative.  Fact Sheet for Patients:  BloggerCourse.com  Fact Sheet for Healthcare Providers:  SeriousBroker.it  This test is no t yet approved or cleared by the [place] FDA and  has been authorized for detection and/or diagnosis of SARS-CoV-2 by FDA under an Emergency Use Authorization (EUA). This EUA will remain  in effect (meaning this test can be used) for the duration of the COVID-19 declaration under Section 564(b)(1) of the Act, 21 U.S.C.section 360bbb-3(b)(1), unless the authorization is terminated  or revoked sooner.       Influenza A by PCR NEGATIVE NEGATIVE Final   Influenza B by PCR NEGATIVE NEGATIVE Final    Comment: (NOTE) The Xpert Xpress SARS-CoV-2/FLU/RSV plus assay is intended as an aid in the diagnosis of influenza from Nasopharyngeal swab specimens and should not be used as a sole basis  for treatment. Nasal washings and aspirates are unacceptable for Xpert Xpress SARS-CoV-2/FLU/RSV testing.  Fact Sheet for Patients: BloggerCourse.com  Fact Sheet for Healthcare Providers: SeriousBroker.it  This test is not yet approved or cleared by the [place] FDA and has been authorized for detection and/or diagnosis of SARS-CoV-2 by FDA under an Emergency Use Authorization (EUA). This EUA will remain in effect (meaning this test can be used) for the duration of the COVID-19 declaration under Section 564(b)(1) of the Act, 21 U.S.C. section 360bbb-3(b)(1), unless the authorization is terminated or revoked.  Performed at [facility], [address] [place], [place] [address]      Labs: BNP (last 3 results) No results for input(s): BNP in the last 8760 hours. Basic Metabolic Panel: Recent Labs  Lab 07/10/20 0210  NA 139  K 3.6  CL 104  CO2 23  GLUCOSE 109*  BUN 15  CREATININE 1.31*  CALCIUM 8.8*   Liver Function Tests: Recent Labs  Lab 07/10/20 0210  AST 16  ALT 17  ALKPHOS 53  BILITOT 0.7  PROT 6.8  ALBUMIN 3.7   No results for input(s): LIPASE, AMYLASE in the last 168 hours. No results for input(s): AMMONIA in the last 168 hours. CBC: Recent Labs  Lab 07/10/20 0210  WBC 5.5  NEUTROABS 2.4  HGB 14.2  HCT 43.3  MCV 86.9  PLT 193   Cardiac Enzymes: No results for input(s): CKTOTAL, CKMB, CKMBINDEX, TROPONINI in the  last 168 hours. BNP: Invalid input(s): POCBNP CBG: Recent Labs  Lab 07/10/20 1156 07/10/20 1621 07/10/20 2040 07/11/20 0844 07/11/20 1210  GLUCAP 173* 147* 169* 127* 163*   D-Dimer No results for input(s): DDIMER in the last 72 hours. Hgb A1c Recent Labs    07/10/20 0451  HGBA1C 7.2*   Lipid Profile Recent Labs    07/10/20 0451  CHOL 167  HDL 45  LDLCALC 100*  TRIG 111  CHOLHDL 3.7   Thyroid function studies No results for input(s): TSH, T4TOTAL,  T3FREE, THYROIDAB in the last 72 hours.  Invalid input(s): FREET3 Anemia work up No results for input(s): VITAMINB12, FOLATE, FERRITIN, TIBC, IRON, RETICCTPCT in the last 72 hours. Urinalysis    Component Value Date/Time   COLORURINE YELLOW (A) 08/21/2017 1850   APPEARANCEUR CLEAR (A) 08/21/2017 1850   LABSPEC 1.027 08/21/2017 1850   PHURINE 5.0 08/21/2017 1850   GLUCOSEU >=500 (A) 08/21/2017 1850   HGBUR NEGATIVE 08/21/2017 1850   BILIRUBINUR NEGATIVE 08/21/2017 1850   KETONESUR NEGATIVE 08/21/2017 1850   PROTEINUR NEGATIVE 08/21/2017 1850   NITRITE NEGATIVE 08/21/2017 1850   LEUKOCYTESUR NEGATIVE 08/21/2017 1850   Sepsis Labs Invalid input(s): PROCALCITONIN,  WBC,  LACTICIDVEN Microbiology Recent Results (from the past 240 hour(s))  Resp Panel by RT-PCR (Flu A&B, Covid) Nasopharyngeal Swab     Status: None   Collection Time: 07/10/20  2:10 AM   Specimen: Nasopharyngeal Swab; Nasopharyngeal(NP) swabs in vial transport medium  Result Value Ref Range Status   SARS Coronavirus 2 by RT PCR NEGATIVE NEGATIVE Final    Comment: (NOTE) SARS-CoV-2 target nucleic acids are NOT DETECTED.  The SARS-CoV-2 RNA is generally detectable in upper respiratory specimens during the acute phase of infection. The lowest concentration of SARS-CoV-2 viral copies this assay can detect is 138 copies/mL. A negative result does not preclude SARS-Cov-2 infection and should not be used as the sole basis for treatment or other patient management decisions. A negative result may occur with  improper specimen collection/handling, submission of specimen other than nasopharyngeal swab, presence of viral mutation(s) within the areas targeted by this assay, and inadequate number of viral copies(<138 copies/mL). A negative result must be combined with clinical observations, patient history, and epidemiological information. The expected result is Negative.  Fact Sheet for Patients:   BloggerCourse.com  Fact Sheet for Healthcare Providers:  SeriousBroker.it  This test is no t yet approved or cleared by the [place] FDA and  has been authorized for detection and/or diagnosis of SARS-CoV-2 by FDA under an Emergency Use Authorization (EUA). This EUA will remain  in effect (meaning this test can be used) for the duration of the COVID-19 declaration under Section 564(b)(1) of the Act, 21 U.S.C.section 360bbb-3(b)(1), unless the authorization is terminated  or revoked sooner.       Influenza A by PCR NEGATIVE NEGATIVE Final   Influenza B by PCR NEGATIVE NEGATIVE Final    Comment: (NOTE) The Xpert Xpress SARS-CoV-2/FLU/RSV plus assay is intended as an aid in the diagnosis of influenza from Nasopharyngeal swab specimens and should not be used as a sole basis for treatment. Nasal washings and aspirates are unacceptable for Xpert Xpress SARS-CoV-2/FLU/RSV testing.  Fact Sheet for Patients: BloggerCourse.com  Fact Sheet for Healthcare Providers: SeriousBroker.it  This test is not yet approved or cleared by the [place] FDA and has been authorized for detection and/or diagnosis of SARS-CoV-2 by FDA under an Emergency Use Authorization (EUA). This EUA will remain in effect (meaning this test  can be used) for the duration of the COVID-19 declaration under Section 564(b)(1) of the Act, 21 U.S.C. section 360bbb-3(b)(1), unless the authorization is terminated or revoked.  Performed at [facility], [address], [place], [place] [address]    Time spent:  SIGNED:   Rickey Barbara, MD  [facility] 07/11/2020, 12:54 PM  If 7PM-7AM, please contact night-coverage

## 2020-07-11 NOTE — Evaluation (Signed)
Occupational Therapy Evaluation Patient Details Name: Roger Gilmore MRN: [id number] DOB: [birth year] Today's Date: 07/11/2020    History of Present Illness Per MD note:Roger Gilmore is a 69 y.o. male with medical history significant for DM, HTN, glaucoma related blindness, morbid obesity who presents to the emergency room with left-sided weakness.  Patient stated that he noticed weakness in his left upper extremity while he was looking at TV that persisted when he went to bed at around 2100.  At around midnight, when he awoke to go to the bathroom he felt like his left leg was heavy and noticed weakness in his left arm.  He had no weakness in the face, slurring of the speech, and had no headache, nausea or vomiting his girlfriend called EMS.  She states that he was unable to stand up without assistance and she helped him get dressed   Clinical Impression   Patient presenting with decreased I in self care, balance, functional mobility/transfers, endurance and safety awareness. Patient lives alone but has girlfriend who is available and at home quite often per his report. Pt is blind and typically takes short shuffling steps without use of AD PTA. After hearing home set up and pt with visual deficits, OT requesting pt to attempt self care tasks and functional ambulation without use of AD. Pt able to demonstrate all toileting needs, ambulation, and self care tasks all with supervision and cuing for direction secondary to visual impairments. Pt reports feeling close to baseline. OT also educating pt on secondary stroke risk and stroke symptoms. Pt verbalized understanding. Patient will benefit from acute OT to increase overall independence in the areas of ADLs, functional mobility, and safety awareness in order to safely discharge back home.    Follow Up Recommendations  Supervision - Intermittent    Equipment Recommendations  None recommended by OT    Recommendations for Other Services Other  (comment) (none at this time)     Precautions / Restrictions Precautions Precautions: Fall Precaution Comments: Blind Restrictions Weight Bearing Restrictions: No      Mobility Bed Mobility Overal bed mobility: Needs Assistance Bed Mobility: Supine to Sit;Sit to Supine     Supine to sit: Supervision Sit to supine: Supervision   General bed mobility comments: increased time    Transfers Overall transfer level: Needs assistance Equipment used: None Transfers: Sit to/from Stand Sit to Stand: Supervision         General transfer comment: Cues for sequencing    Balance Overall balance assessment: No apparent balance deficits (not formally assessed)          ADL either performed or assessed with clinical judgement   ADL Overall ADL's : Needs assistance/impaired     Grooming: Wash/dry hands;Wash/dry face;Oral care;Standing;Supervision/safety      Toilet Transfer: Supervision/safety   Toileting- Architect and Hygiene: Supervision/safety;Sit to/from stand       Functional mobility during ADLs: Supervision/safety General ADL Comments: supervision overall with cuing secondary to visual impairment in new environment     Vision Baseline Vision/History: Legally blind Patient Visual Report: No change from baseline              Pertinent Vitals/Pain Pain Assessment: No/denies pain        Extremity/Trunk Assessment Upper Extremity Assessment Upper Extremity Assessment: Overall WFL for tasks assessed   Lower Extremity Assessment Lower Extremity Assessment: Overall WFL for tasks assessed          Cognition Arousal/Alertness: Awake/alert Behavior During Therapy: [facility] for tasks  assessed/performed Overall Cognitive Status: Within Functional Limits for tasks assessed             Exercises Total Joint Exercises Ankle Circles/Pumps: AROM;Strengthening;Both;10 reps Quad Sets: Strengthening;Both;10 reps Gluteal Sets: Strengthening;Both;10  reps Heel Slides: Strengthening;Both;10 reps Long Arc Quad: AROM;Strengthening;Both;10 reps Other Exercises Other Exercises: HEP education for BLE APs, QS, and GS x 10 each every 1-2 hours        Home Living Family/patient expects to be discharged to:: Private residence Living Arrangements: Spouse/significant other Available Help at Discharge: Friend(s) Type of Home: House Home Access: Stairs to enter Secretary/administrator of Steps: 14 Entrance Stairs-Rails: Right Home Layout: Two level                 OT Problem List: Decreased strength;Decreased safety awareness;Decreased activity tolerance;Impaired balance (sitting and/or standing);Decreased knowledge of use of DME or AE;Decreased knowledge of precautions      OT Treatment/Interventions: Self-care/ADL training;Therapeutic exercise;Therapeutic activities;Energy conservation;DME and/or AE instruction;Patient/family education;Balance training    OT Goals(Current goals can be found in the care plan section) Acute Rehab OT Goals Patient Stated Goal: to go home OT Goal Formulation: With patient Time For Goal Achievement: 07/25/20 Potential to Achieve Goals: Good ADL Goals Pt Will Perform Grooming: Independently Pt Will Perform Upper Body Bathing: with modified independence Pt Will Perform Lower Body Dressing: with modified independence Pt Will Transfer to Toilet: with modified independence Pt Will Perform Toileting - Clothing Manipulation and hygiene: with modified independence  OT Frequency: Min 1X/week   Barriers to D/C:    none at this time          AM-PAC OT "6 Clicks" Daily Activity     Outcome Measure Help from another person eating meals?: None Help from another person taking care of personal grooming?: A Little Help from another person toileting, which includes using toliet, bedpan, or urinal?: A Little Help from another person bathing (including washing, rinsing, drying)?: A Little Help from another  person to put on and taking off regular upper body clothing?: A Little Help from another person to put on and taking off regular lower body clothing?: A Little 6 Click Score: 19   End of Session Nurse Communication: Mobility status  Activity Tolerance: Patient tolerated treatment well Patient left: in bed;with call bell/phone within reach;with bed alarm set  OT Visit Diagnosis: Unsteadiness on feet (R26.81)                Time: 2119-4174 OT Time Calculation (min): 34 min Charges:  OT General Charges $OT Visit: 1 Visit OT Evaluation $OT Eval Low Complexity: 1 Low OT Treatments $Self Care/Home Management : 8-22 mins $Therapeutic Activity: 8-22 mins  Jackquline Denmark, MS, OTR/L , CBIS ascom [phone]  07/11/20, 12:34 PM  07/11/2020, 12:33 PM

## 2020-07-11 NOTE — Progress Notes (Signed)
SLP Cancellation Note  Patient Details Name: Roger Gilmore MRN: [id number] DOB: 29-Jun-1951   Cancelled treatment:       Reason Eval/Treat Not Completed: SLP screened, no needs identified, will sign off   Pt's cognitive communication skills are at baseline. He is able to communicate al wants and needs. He has support in place at home. Should pt require ST services upon returning home, he is aware that he can request a consult for Outpatient or HHST.   [name] B. Dreama Saa M.S., CCC-SLP, [facility] Speech-Language Pathologist Rehabilitation Services Office [phone]    [name] 07/11/2020, 9:46 AM

## 2020-07-11 NOTE — Plan of Care (Signed)
  Problem: Education: Goal: Knowledge of General Education information will improve Description: Including pain rating scale, medication(s)/side effects and non-pharmacologic comfort measures Outcome: Adequate for Discharge   

## 2020-07-11 NOTE — Care Management Obs Status (Signed)
MEDICARE OBSERVATION STATUS NOTIFICATION   Patient Details  Name: Roger Gilmore MRN: [id number] Date of Birth: 14-May-1951   Medicare Observation Status Notification Given:  Yes    Reuel Boom Sebastian Dzik, LCSW 07/11/2020, 3:40 PM

## 2020-07-11 NOTE — Care Management CC44 (Signed)
Condition Code 44 Documentation Completed  Patient Details  Name: Roger Gilmore MRN: [id number] Date of Birth: 1951/03/18   Condition Code 44 given:  Yes Patient signature on Condition Code 44 notice:  Yes Documentation of 2 MD's agreement:  Yes Code 44 added to claim:  Yes    Reuel Boom Blaize Nipper, LCSW 07/11/2020, 3:40 PM

## 2020-07-25 NOTE — Progress Notes (Signed)
[facility]  Physical Therapy Certification  Patient Details  Name: JOSEDANIEL HAYE MRN: [id number] Date of Birth: [birth year] Medical Diagnosis: Principal Problem:   Acute CVA (cerebrovascular accident) [facility]) Active Problems:   Blindness   DM type 2 (diabetes mellitus, type 2) (HCC)   Hypertension   Morbid obesity with BMI of 50.0-59.9, adult (HCC)  Visit Diagnosis: PT Visit Diagnosis: Unsteadiness on feet (R26.81),Muscle weakness (generalized) (M62.81),Difficulty in walking, not elsewhere classified (R26.2) PT Problem: Decreased strength,Decreased activity tolerance,Decreased mobility  Goals: Patient will transfer sit to/from stand: with modified independence Pt will Ambulate: with modified independence,> 125 feet Pt will Go Up / Down Stairs: Flight,with min guard assist  Duration: Services will be provided through the following date: 07/24/20  Frequency: 7X/week  Amount: one treatment session per day unless otherwise indicated.    Certification Start Date: 07/10/2020 Certification End Date: 07/24/20   PT Treatments/Interventions: Gait training,Therapeutic activities,Therapeutic exercise   Judson Tsan H. Manson Passey, PT, DPT, NCS 07/25/20, 11:47 PM [phone]

## 2020-07-25 NOTE — Progress Notes (Signed)
[facility]  Occupational Therapy Certification  Patient Details  Name: Roger Gilmore MRN: [id number] Date of Birth: [birth year] Medical Diagnosis: Principal Problem:   Acute CVA (cerebrovascular accident) [facility]) Active Problems:   Blindness   DM type 2 (diabetes mellitus, type 2) (HCC)   Hypertension   Morbid obesity with BMI of 50.0-59.9, adult (HCC)  Visit Diagnosis: OT Visit Diagnosis: Unsteadiness on feet (R26.81) OT Problem: Decreased strength,Decreased safety awareness,Decreased activity tolerance,Impaired balance (sitting and/or standing),Decreased knowledge of use of DME or AE,Decreased knowledge of precautions  Goals: Pt Will Perform Grooming: Independently Pt Will Perform Upper Body Bathing: with modified independence Pt Will Perform Lower Body Dressing: with modified independence Pt Will Transfer to Toilet: with modified independence Pt Will Perform Toileting - Clothing Manipulation and hygiene: with modified independence  Duration: Services will be provided through the following date: 07/25/20  Frequency: Min 1X/week  Amount: One treatment session per day unless otherwise indicated:  Certification Start Date: 07/11/2020 Certification End Date: 07/25/20   OT Treatments/Interventions: Self-care/ADL training,Therapeutic exercise,Therapeutic activities,Energy conservation,DME and/or AE instruction,Patient/family education,Balance training   Jannae Fagerstrom H. Manson Passey, PT, DPT, NCS 07/25/20, 11:49 PM [phone]

## 2022-02-12 IMAGING — MR MR HEAD W/O CM
8 of 10 series · 38 of 48 positions shown · non-contrast
Comparison: 07/10/2020 head CT.
COMPARISON: 07/10/2020 head CT.

Addendum:
CLINICAL DATA: Neuro deficit, acute, stroke suspected, left lower
extremity weakness.

EXAM:
MRI HEAD WITHOUT CONTRAST
MRA HEAD WITHOUT CONTRAST
TECHNIQUE: Multiplanar, multiecho pulse sequences of the brain and surrounding
structures were obtained without intravenous contrast. Angiographic
images of the head were obtained using MRA technique without
contrast.

[Series 5: ax dwi_tracew · axial · 3.0mm · 0.71mm/px · z∈[-113,+51]mm · 8 of 56 slices shown]
[im 1/56]
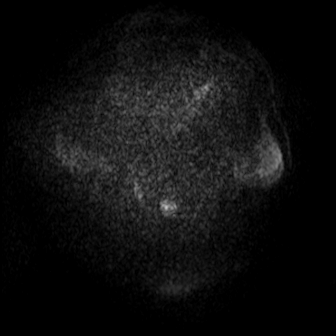
[im 8/56]
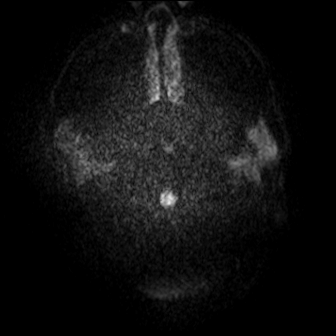
[im 16/56]
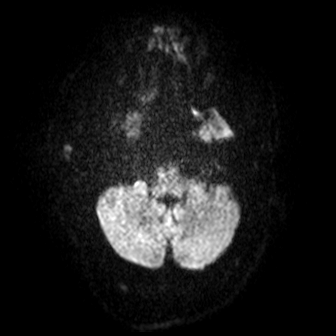
[im 24/56]
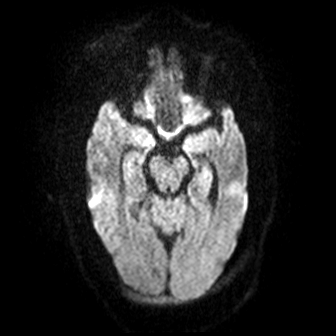
[im 32/56]
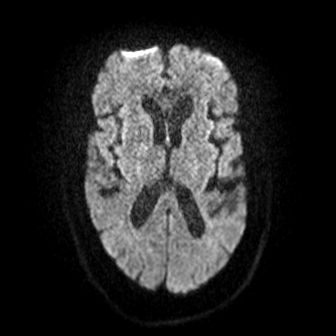
[im 40/56]
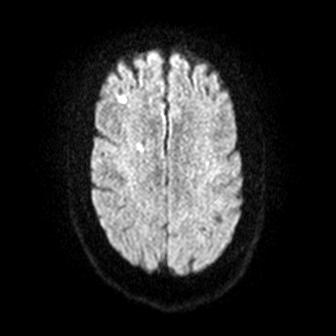
[im 48/56]
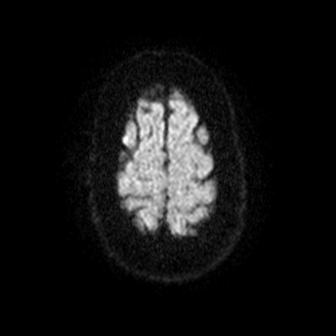
[im 56/56]
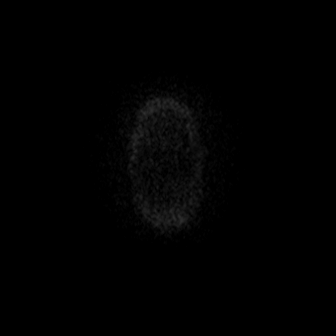

[Series 6: ax dwi_adc · axial · 3.0mm · 0.71mm/px · z∈[-113,+51]mm · 7 of 56 slices shown]
[im 1/56]
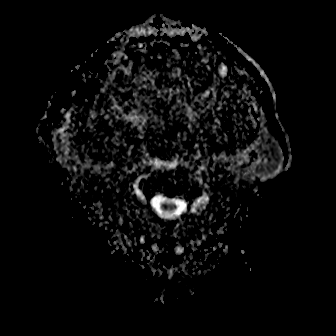
[im 10/56]
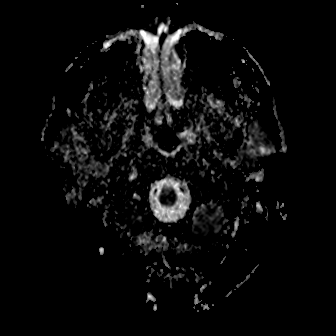
[im 19/56]
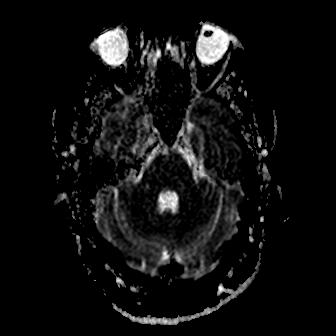
[im 28/56]
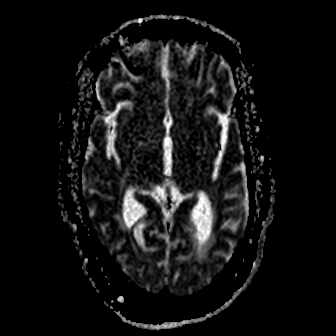
[im 37/56]
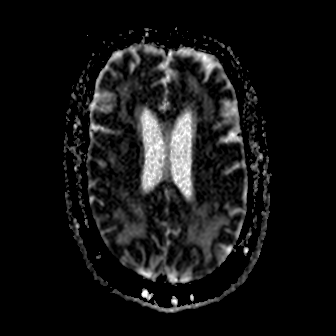
[im 46/56]
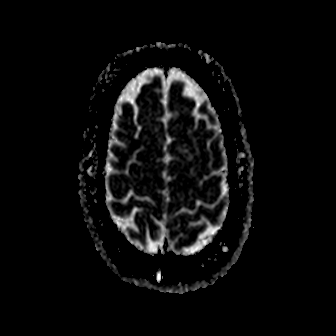
[im 56/56]
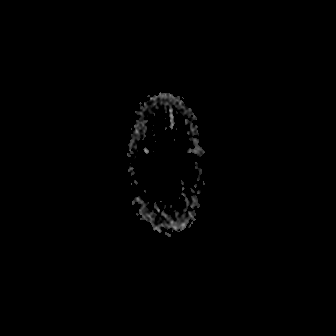

[Series 7: cor dwi_tracew · coronal · 5.0mm · 0.68mm/px · 3 of 40 slices shown]
[im 1/40]
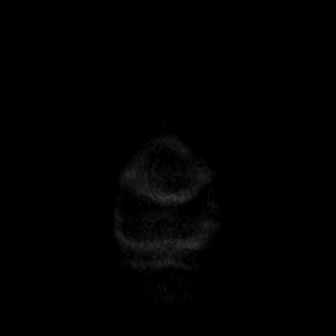
[im 10/40]
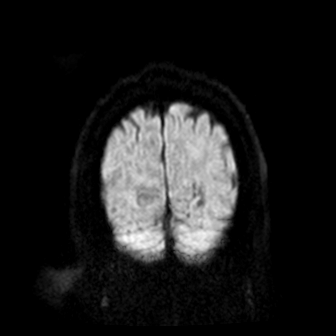
[im 20/40]
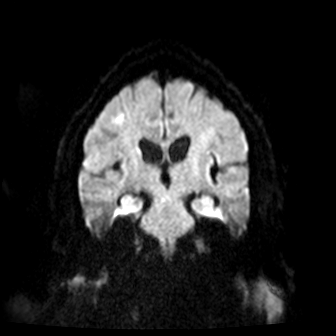

[Series 9: T1 · sagittal · 5.0mm · 0.62mm/px · 3 of 25 slices shown (1 of 2)]
[im 1/25]
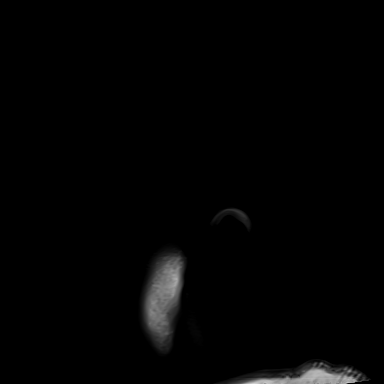
[im 13/25]
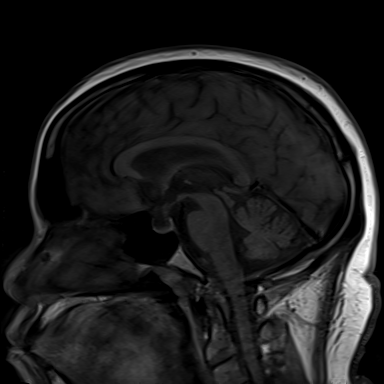
[im 25/25]
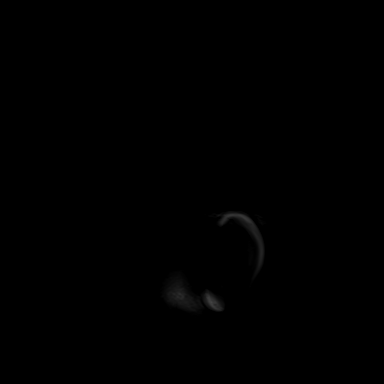

[Series 10: T1 · axial · 5.0mm · 0.90mm/px · z∈[-108,+47]mm · 3 of 27 slices shown (2 of 2)]
[im 1/27]
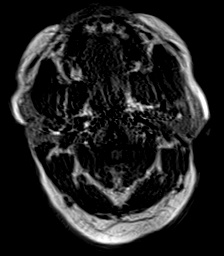
[im 14/27]
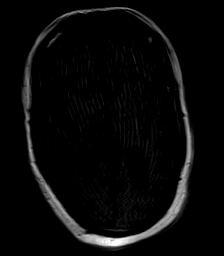
[im 27/27]
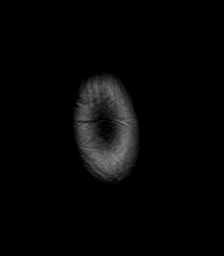

[Series 11: T2 · axial · 5.0mm · 0.45mm/px · z∈[-108,+47]mm · 3 of 27 slices shown (1 of 2)]
[im 1/27]
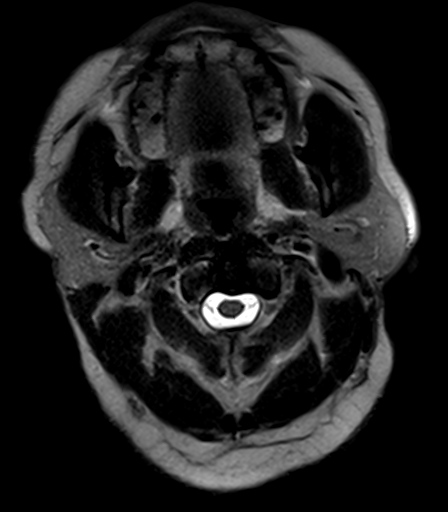
[im 14/27]
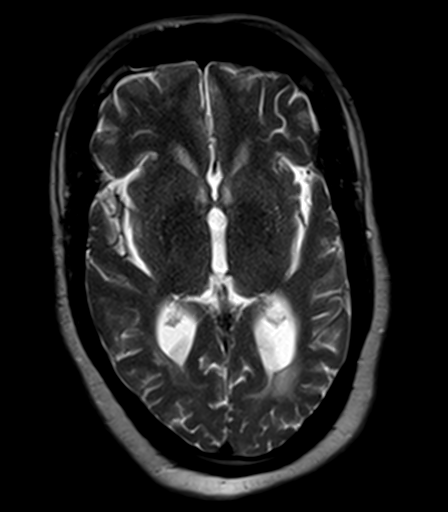
[im 27/27]
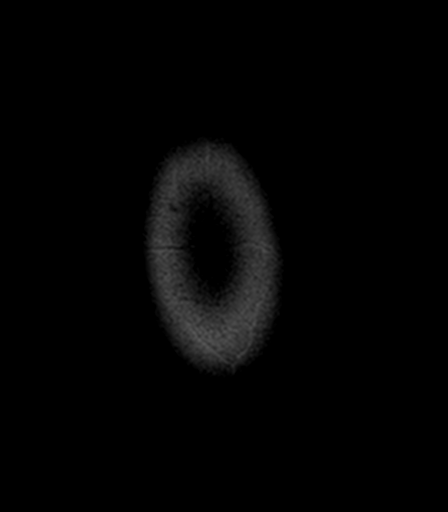

[Series 13: FLAIR · axial · 3.0mm · 1.20mm/px · z∈[-112,+49]mm · 7 of 55 slices shown]
[im 1/55]
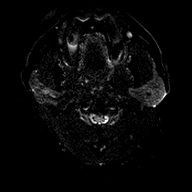
[im 10/55]
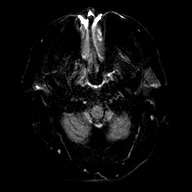
[im 19/55]
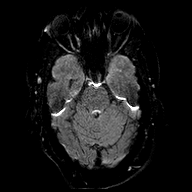
[im 28/55]
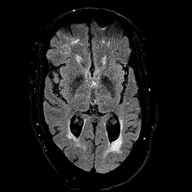
[im 37/55]
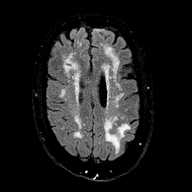
[im 46/55]
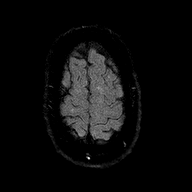
[im 55/55]
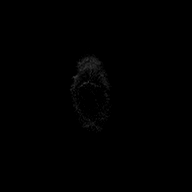

[Series 14: T2 · coronal · 5.0mm · 0.45mm/px · 4 of 31 slices shown (2 of 2)]
[im 1/31]
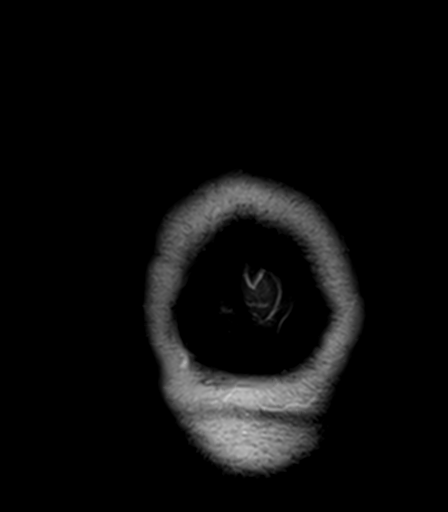
[im 11/31]
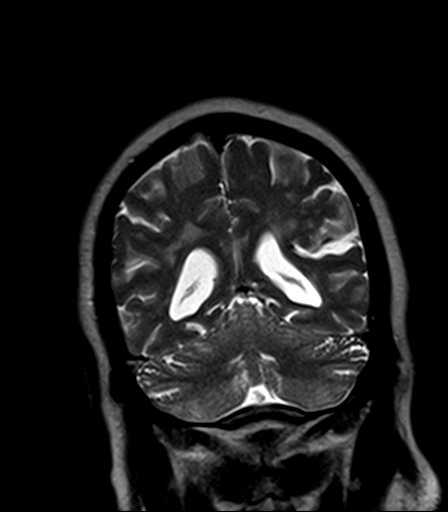
[im 21/31]
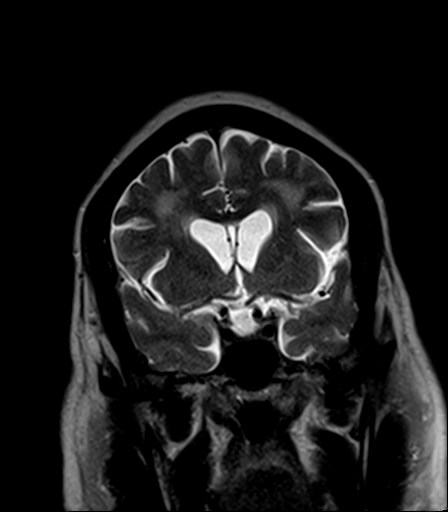
[im 31/31]
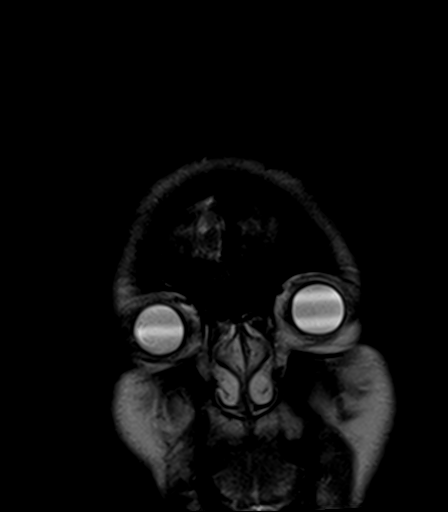

[38 of 48 positions shown; findings below may reference images not displayed]

FINDINGS: MRI HEAD FINDINGS

Brain: Multifocal restricted diffusion involving the right frontal
lobe. No intracranial hemorrhage. No midline shift, ventriculomegaly
or extra-axial fluid collection. No mass lesion. Mild cerebral
atrophy with ex vacuo dilatation. Chronic right centrum semiovale
lacunar insult. Advanced chronic microvascular ischemic changes.

Vascular: Please see MRA.

Skull and upper cervical spine: Normal marrow signal.

Sinuses/Orbits: Postprocedural appearance of the right orbit.
Minimal left maxillary sinus mucosal thickening. Clear mastoid air
cells.

Other: Please note that image quality is mildly degraded by motion
artifact.

MRA HEAD FINDINGS

Anterior circulation: Patent ICAs. Moderate narrowing of the
proximal right M1 segment. Distal right MCA branches demonstrate
asymmetrically diminished opacification. Patent ACAs. No aneurysm.

Posterior circulation: Dominant left vertebral artery. Patent V4
segments and proximal PICA. Patent basilar, superior cerebellar and
posterior cerebral arteries. Fetal origin of the left PCA. No
aneurysm.

Venous sinuses: No evidence of thrombosis.

Anatomic variants: Discussed above.
IMPRESSION: Multifocal acute right frontal infarcts. Chronic right centrum
semiovale lacunar insult.

Mild cerebral atrophy. Advanced chronic microvascular ischemic
changes.

Moderate right M1 segment narrowing with asymmetrically diminished
opacification of right MCA distal branches.

ADDENDUM:
These results were called by telephone at the time of interpretation
on 07/10/2020 at [DATE] to provider Dr. Lorraine, Who verbally
acknowledged these results.

*** End of Addendum ***
FINDINGS: MRI HEAD FINDINGS

Brain: Multifocal restricted diffusion involving the right frontal
lobe. No intracranial hemorrhage. No midline shift, ventriculomegaly
or extra-axial fluid collection. No mass lesion. Mild cerebral
atrophy with ex vacuo dilatation. Chronic right centrum semiovale
lacunar insult. Advanced chronic microvascular ischemic changes.

Vascular: Please see MRA.

Skull and upper cervical spine: Normal marrow signal.

Sinuses/Orbits: Postprocedural appearance of the right orbit.
Minimal left maxillary sinus mucosal thickening. Clear mastoid air
cells.

Other: Please note that image quality is mildly degraded by motion
artifact.

MRA HEAD FINDINGS

Anterior circulation: Patent ICAs. Moderate narrowing of the
proximal right M1 segment. Distal right MCA branches demonstrate
asymmetrically diminished opacification. Patent ACAs. No aneurysm.

Posterior circulation: Dominant left vertebral artery. Patent V4
segments and proximal PICA. Patent basilar, superior cerebellar and
posterior cerebral arteries. Fetal origin of the left PCA. No
aneurysm.

Venous sinuses: No evidence of thrombosis.

Anatomic variants: Discussed above.
IMPRESSION: Multifocal acute right frontal infarcts. Chronic right centrum
semiovale lacunar insult.

Mild cerebral atrophy. Advanced chronic microvascular ischemic
changes.

Moderate right M1 segment narrowing with asymmetrically diminished
opacification of right MCA distal branches.

## 2024-03-05 ENCOUNTER — Encounter: Payer: Self-pay | Admitting: Urology

## 2024-03-05 ENCOUNTER — Ambulatory Visit: Admitting: Urology

## 2024-03-05 VITALS — BP 134/78 | HR 82 | Ht 66.0 in | Wt 270.0 lb

## 2024-03-05 DIAGNOSIS — R972 Elevated prostate specific antigen [PSA]: Secondary | ICD-10-CM

## 2024-03-05 NOTE — Progress Notes (Signed)
 I, Maysun LITTIE Griffiths, acting as a scribe for Glendia JAYSON Barba, MD., have documented all relevant documentation on the behalf of Glendia JAYSON Barba, MD, as directed by Glendia JAYSON Barba, MD while in the presence of Glendia JAYSON Barba, MD.  03/05/2024 2:56 PM   Roger Gilmore 1951/03/19 [id number]  Referring provider: Rudolpho Roger BIRCH, MD [address] [facility] [address] [facility] [place],  [place] [address]  Chief Complaint  Patient presents with   New Patient (Initial Visit)    HPI: Roger Gilmore is a 72 y.o. male referred for evaluation of an elevated PSA .  PSA 01/24/24 mildly elevated 5.68; prior PSA June 2024 was 4.76 and 3.85 in June 2023. Only voiding symptom is nocturiax3 Denies recurrent UTIs No family history of prostate cancer.   PMH: Past Medical History:  Diagnosis Date   Blind in both eyes    Diabetes mellitus without complication (HCC)    pre-diabetes on metformin    Glaucoma    Obesity     Surgical History: Past Surgical History:  Procedure Laterality Date   EYE SURGERY     PHOTOCOAGULATION WITH LASER Left 02/09/2016   Procedure: PHOTOCOAGULATION WITH LASER;  Surgeon: Donzell Arlyce Budd, MD;  Location: [facility];  Service: Ophthalmology;  Laterality: Left;  oral meds-prediabetes   TONSILLECTOMY      Home Medications:  Allergies as of 03/05/2024   No Known Allergies      Medication List        Accurate as of March 05, 2024  2:56 PM. If you have any questions, ask your nurse or doctor.          amLODipine 5 MG tablet Commonly known as: NORVASC Take 5 mg by mouth daily.   aspirin  81 MG chewable tablet Chew 81 mg by mouth daily. am   atorvastatin  40 MG tablet Commonly known as: LIPITOR Take 1 tablet (40 mg total) by mouth daily at 6 PM.   brimonidine  0.2 % ophthalmic solution Commonly known as: ALPHAGAN  Apply 1 drop to eye in the morning, at noon, and at bedtime.   dorzolamide -timolol  2-0.5 % ophthalmic solution Commonly known as:  COSOPT  INSTILL 1 DROP INTO BOTH EYES TWICE A DAY   glimepiride 4 MG tablet Commonly known as: AMARYL Take 4 mg by mouth daily.   latanoprost  0.005 % ophthalmic solution Commonly known as: XALATAN  Place 1 drop into both eyes at bedtime.   metFORMIN  850 MG tablet Commonly known as: GLUCOPHAGE  Take 850 mg by mouth 2 (two) times daily.        Allergies: No Known Allergies  Family History: Family History  Problem Relation Age of Onset   Stroke Mother     Social History:  reports that he has been smoking cigarettes. He has a 11.3 pack-year smoking history. He has never used smokeless tobacco. He reports current alcohol use. He reports that he does not use drugs.   Physical Exam: BP 134/78   Pulse 82   Ht 5' 6 (1.676 m)   Wt 270 lb (122.5 kg)   BMI 43.58 kg/m   Constitutional:  Alert and oriented, No acute distress. HEENT: [place] AT, moist mucus membranes.  Trachea midline, no masses. Cardiovascular: No clubbing, cyanosis, or edema. Respiratory: Normal respiratory effort, no increased work of breathing. GI: Abdomen is soft, nontender, nondistended, no abdominal masses GU: Prostate 45 grams, smooth without nodules, or induration.  Skin: No rashes, bruises or suspicious lesions. Neurologic: Grossly intact, no focal deficits, moving all  4 extremities. Psychiatric: Normal mood and affect.   Assessment & Plan:    1. Elevated PSA Although PSA is a prostate cancer screening test he was informed that cancer is not the most common cause of an elevated PSA. Other potential causes including BPH and inflammation were discussed.  He was informed that the only way to adequately diagnose prostate cancer would be transrectal ultrasound and biopsy of the prostate. The procedure was discussed including potential risks of bleeding and infection/sepsis. He was also informed that a negative biopsy does not conclusively rule out the possibility that prostate cancer may be present and that  continued monitoring is required.  The use of newer adjunctive blood and urine tests to predict the probability of high-grade prostate cancer were discussed. The use of multiparametric prostate MRI to evaluate for lesions suspicious for high grade prostate cancer and aid in targeted bx was reviewed.  Continued periodic surveillance was also discussed. He has elected to proceed with prostate MRI.  [facility] [address], [address] [place], [place] [address] [phone]

## 2024-03-05 NOTE — Patient Instructions (Signed)
 Please contact Central Scheduling to set up your prostate MRI at [phone].  Prostate MRI Prep:  1- No ejaculation 48 hours prior to exam  2- No caffeine or carbonated beverages on day of the exam  3- Eat light diet evening prior and day of exam  4- Avoid eating 4 hours prior to exam  5- Fleets enema needs to be done 4 hours prior to exam -See below. Can be purchased at the drug store.

## 2024-03-13 ENCOUNTER — Ambulatory Visit
Admission: RE | Admit: 2024-03-13 | Discharge: 2024-03-13 | Disposition: A | Discharge status: Home / Self Care | Source: Ambulatory Visit | Attending: Urology | Admitting: Urology

## 2024-03-13 DIAGNOSIS — R972 Elevated prostate specific antigen [PSA]: Secondary | ICD-10-CM | POA: Diagnosis present

## 2024-03-13 MED ORDER — GADOBUTROL 1 MMOL/ML IV SOLN
10.0000 mL | Freq: Once | INTRAVENOUS | Status: AC | PRN
  Administered 2024-03-13: 10 mL via INTRAVENOUS

## 2024-03-25 ENCOUNTER — Ambulatory Visit: Payer: Self-pay | Admitting: Urology

## 2024-07-19 ENCOUNTER — Encounter: Payer: Self-pay | Admitting: Urology

## 2024-08-13 ENCOUNTER — Emergency Department

## 2024-08-13 ENCOUNTER — Other Ambulatory Visit: Payer: Self-pay

## 2024-08-13 ENCOUNTER — Emergency Department: Admission: EM | Admit: 2024-08-13 | Discharge: 2024-08-14 | Disposition: A

## 2024-08-13 DIAGNOSIS — R32 Unspecified urinary incontinence: Secondary | ICD-10-CM

## 2024-08-13 DIAGNOSIS — R531 Weakness: Secondary | ICD-10-CM | POA: Insufficient documentation

## 2024-08-13 DIAGNOSIS — R059 Cough, unspecified: Secondary | ICD-10-CM | POA: Diagnosis present

## 2024-08-13 DIAGNOSIS — Z8673 Personal history of transient ischemic attack (TIA), and cerebral infarction without residual deficits: Secondary | ICD-10-CM | POA: Diagnosis not present

## 2024-08-13 DIAGNOSIS — I10 Essential (primary) hypertension: Secondary | ICD-10-CM | POA: Diagnosis not present

## 2024-08-13 DIAGNOSIS — J101 Influenza due to other identified influenza virus with other respiratory manifestations: Secondary | ICD-10-CM | POA: Insufficient documentation

## 2024-08-13 DIAGNOSIS — E119 Type 2 diabetes mellitus without complications: Secondary | ICD-10-CM | POA: Insufficient documentation

## 2024-08-13 DIAGNOSIS — R262 Difficulty in walking, not elsewhere classified: Secondary | ICD-10-CM

## 2024-08-13 MED ORDER — SODIUM CHLORIDE 0.9 % IV BOLUS
1000.0000 mL | Freq: Once | INTRAVENOUS | Status: AC
  Administered 2024-08-14: 1000 mL via INTRAVENOUS

## 2024-08-13 MED ORDER — ACETAMINOPHEN 500 MG PO TABS
1000.0000 mg | ORAL_TABLET | Freq: Once | ORAL | Status: AC
  Administered 2024-08-14: 1000 mg via ORAL
  Filled 2024-08-13: qty 2

## 2024-08-13 NOTE — ED Provider Notes (Signed)
 "  [facility] Provider Note    Event Date/Time   First MD Initiated Contact with Patient 08/13/24 2331     (approximate)   History   Urinary Incontinence and Fever (/)  Patient brought in by EMS from home for urinary incontinence and shuffling gait since tremor episode at 1430, no history of same. Patient remembers tremor episode, states everything just started shaking. New onset urinary incontinence since. 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 AxOx4 in triage.   HPI Roger Gilmore is a 73 y.o. male PMH diabetes, hypertension, prior CVA, BMI greater than 50, legal blindness presents for evaluation of multiple complaints including a convulsive episode earlier today, no difficulty walking, urinary incontinence -Per EMS, glucose in the 200s.  Vital signs stable.  No focal motor deficits appreciated. - On my evaluation, patient states around 2:30 this afternoon he ate a piece of pizza, was going outside to smoke but felt his whole body was trembling.  Says this lasted for several minutes.  Did not lose consciousness.  No urinary continence at that time. -Does endorse some recent cough, mild sore throat, nasal congestion.  Notes he had several grandkids visiting him over Christmas. -Also notes urinary frequency.  Denies dysuria, flank pain.  Has not noticed any fevers though feels quite warm on light touch, I rechecked his temperature and is 102.4 Fahrenheit orally -Does confirm that both of his legs feel much weaker than usual and he is not able to walk like he normally does     Physical Exam   Triage Vital Signs: BP (!) 147/73   Pulse 74   Temp 99 F (37.2 C) (Oral)   Resp 11   Ht 5' 6 (1.676 m)   Wt 121.7 kg   SpO2 97%   BMI 43.29 kg/m     Most recent vital signs: Vitals:   08/14/24 0346 08/14/24 0400  BP:  (!) 147/73  Pulse: 69 74  Resp: 15 11  Temp:    SpO2: 96% 97%     General: Awake, no distress.  CV:  Good peripheral  perfusion. RRR, RP 2+ Resp:  Normal effort. CTAB Abd:  No distention. Nontender to deep palpation throughout Neuro:  Aox4, face symmetric, grip 5/5 bilaterally, BUE AG 10+ seconds no drift, has difficulty lifting bilateral legs off gurney.  SI LT.   ED Results / Procedures / Treatments   Labs (all labs ordered are listed, but only abnormal results are displayed) Labs Reviewed  RESP PANEL BY RT-PCR (RSV, FLU A&B, COVID)  RVPGX2 - Abnormal; Notable for the following components:      Result Value   Influenza A by PCR POSITIVE (*)    All other components within normal limits  COMPREHENSIVE METABOLIC PANEL WITH GFR - Abnormal; Notable for the following components:   Sodium 134 (*)    Glucose, Bld 269 (*)    ALT 84 (*)    All other components within normal limits  CBC WITH DIFFERENTIAL/PLATELET - Abnormal; Notable for the following components:   Lymphs Abs 0.5 (*)    All other components within normal limits  URINALYSIS, COMPLETE (UACMP) WITH MICROSCOPIC - Abnormal; Notable for the following components:   Color, Urine YELLOW (*)    APPearance CLEAR (*)    Glucose, UA >=500 (*)    All other components within normal limits  CULTURE, BLOOD (ROUTINE X 2)  CULTURE, BLOOD (ROUTINE X 2)  LACTIC ACID, PLASMA  LACTIC ACID, PLASMA  EKG  N/a   RADIOLOGY Radiology interpreted by myself radiology report reviewed.  No acute pathology identified.    PROCEDURES:  Critical Care performed: No  Procedures   MEDICATIONS ORDERED IN ED: Medications  acetaminophen  (TYLENOL ) tablet 1,000 mg (1,000 mg Oral Given 08/14/24 0002)  sodium chloride  0.9 % bolus 1,000 mL (1,000 mLs Intravenous New Bag/Given 08/14/24 0002)     IMPRESSION / MDM / ASSESSMENT AND PLAN / ED COURSE  I reviewed the triage vital signs and the nursing notes.                              DDX/MDM/AP: Differential diagnosis includes, but is not limited to, viral syndrome including influenza, consider underlying  pneumonia, UTI, suspect likely had rigors as opposed to seizure though consider this possibility, consider but doubt subacute stroke.  Very benign abdominal exam, do not suspect intra-abdominal pathology.  Plan: - Labs  -chest x-ray - CT head - IV fluid, Tylenol  -Highly suspect viral syndrome, will defer antibiotics at this time, low threshold to initiate - Anticipate admission  Patient's presentation is most consistent with acute presentation with potential threat to life or bodily function.  The patient is on the cardiac monitor to evaluate for evidence of arrhythmia and/or significant heart rate changes.  ED course below.  Positive for flu.  Feeling much better after Tylenol  and IV fluid.  Ambulatory with steady gait and amenable to discharge home.  Given age and comorbidities, Rx Tamiflu.  No evidence of underlying UTI.  Plan for PMD follow-up.  ED return precautions in place.  Patient agrees with plan.  Clinical Course as of 08/14/24 0432  Tue Aug 14, 2024  0017 Cbc wnl [MM]  [id number] CMP reviewed, some hyperglycemia but otherwise unremarkable beyond mild elevation of ALT [MM]  0215 Influenza A By PCR(!): POSITIVE [MM]  0215 CTH: IMPRESSION: 1. No acute intracranial abnormality. 2. Diffuse cerebral parenchymal volume loss and severe chronic ischemic microvascular disease. 3. Air-fluid levels within bilateral maxillary sinuses and moderate ethmoid sinus mucosal thickening. 4. Calcified atherosclerotic plaque within cavernous/supraclinoid internal carotid arteries.   [MM]  0215 CXR: IMPRESSION: 1. 9 mm right mid lung nodular opacity; consider chest CT for further evaluation.   [MM]  0218 Reevaluated, notes he is feeling better after Tylenol  and IV fluids, has defervesced here  Family bedside confirms he was too weak to walk and they had to put him in diapers because he could not get to the bathroom  Will attempt to ambulate now as well as collect urine sample [MM]  0247  Patient able to ambulate in room without difficulty, feeling very well, providing urine sample [MM]  0355 Urinalysis with no evidence of infection [MM]    Clinical Course User Index [MM] Clarine Ozell LABOR, MD     FINAL CLINICAL IMPRESSION(S) / ED DIAGNOSES   Final diagnoses:  Weakness  Influenza A     Rx / DC Orders   ED Discharge Orders          Ordered    oseltamivir (TAMIFLU) 75 MG capsule  2 times daily        08/14/24 0356    acetaminophen  (TYLENOL ) 500 MG tablet  Every 6 hours PRN        08/14/24 0356             Note:  This document was prepared using Dragon voice recognition software and may include unintentional dictation errors.  Clarine Ozell LABOR, MD 08/14/24 [phone]  "

## 2024-08-13 NOTE — ED Triage Notes (Addendum)
 Patient brought in by EMS from home for urinary incontinence and shuffling gait since tremor episode at 1430, no history of same. Patient remembers tremor episode, states everything just started shaking. New onset urinary incontinence since. 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 AxOx4 in triage.

## 2024-08-14 ENCOUNTER — Emergency Department

## 2024-08-14 LAB — URINALYSIS, COMPLETE (UACMP) WITH MICROSCOPIC
Bacteria, UA: NONE SEEN
Bilirubin Urine: NEGATIVE
Glucose, UA: >=500 mg/dL — AB
Hgb urine dipstick: NEGATIVE
Ketones, ur: NEGATIVE mg/dL
Leukocytes,Ua: NEGATIVE
Nitrite: NEGATIVE
Protein, ur: NEGATIVE mg/dL
Specific Gravity, Urine: 1.025 (ref 1.005–1.030)
Squamous Epithelial / HPF: 0 /HPF (ref 0–5)
pH: 5 (ref 5.0–8.0)

## 2024-08-14 LAB — CBC WITH DIFFERENTIAL/PLATELET
Abs Immature Granulocytes: 0.02 K/uL (ref 0.00–0.07)
Basophils Absolute: 0 K/uL (ref 0.0–0.1)
Basophils Relative: 1 %
Eosinophils Absolute: 0.1 K/uL (ref 0.0–0.5)
Eosinophils Relative: 2 %
HCT: 45.2 % (ref 39.0–52.0)
Hemoglobin: 15.3 g/dL (ref 13.0–17.0)
Immature Granulocytes: 0 %
Lymphocytes Relative: 8 %
Lymphs Abs: 0.5 K/uL — ABNORMAL LOW (ref 0.7–4.0)
MCH: 28.4 pg (ref 26.0–34.0)
MCHC: 33.8 g/dL (ref 30.0–36.0)
MCV: 84 fL (ref 80.0–100.0)
Monocytes Absolute: 0.5 K/uL (ref 0.1–1.0)
Monocytes Relative: 9 %
Neutro Abs: 5.2 K/uL (ref 1.7–7.7)
Neutrophils Relative %: 80 %
Platelets: 156 K/uL (ref 150–400)
RBC: 5.38 MIL/uL (ref 4.22–5.81)
RDW: 12.3 % (ref 11.5–15.5)
WBC: 6.4 K/uL (ref 4.0–10.5)
nRBC: 0 % (ref 0.0–0.2)

## 2024-08-14 LAB — RESP PANEL BY RT-PCR (RSV, FLU A&B, COVID)  RVPGX2
Influenza A by PCR: POSITIVE — AB
Influenza B by PCR: NEGATIVE
Resp Syncytial Virus by PCR: NEGATIVE
SARS Coronavirus 2 by RT PCR: NEGATIVE

## 2024-08-14 LAB — COMPREHENSIVE METABOLIC PANEL WITH GFR
ALT: 84 U/L — ABNORMAL HIGH (ref 0–44)
AST: 27 U/L (ref 15–41)
Albumin: 4.4 g/dL (ref 3.5–5.0)
Alkaline Phosphatase: 116 U/L (ref 38–126)
Anion gap: 12 (ref 5–15)
BUN: 15 mg/dL (ref 8–23)
CO2: 23 mmol/L (ref 22–32)
Calcium: 9.5 mg/dL (ref 8.9–10.3)
Chloride: 99 mmol/L (ref 98–111)
Creatinine, Ser: 1.17 mg/dL (ref 0.61–1.24)
GFR, Estimated: >60 mL/min
Glucose, Bld: 269 mg/dL — ABNORMAL HIGH (ref 70–99)
Potassium: 4.8 mmol/L (ref 3.5–5.1)
Sodium: 134 mmol/L — ABNORMAL LOW (ref 135–145)
Total Bilirubin: 0.6 mg/dL (ref 0.0–1.2)
Total Protein: 7.9 g/dL (ref 6.5–8.1)

## 2024-08-14 LAB — LACTIC ACID, PLASMA: Lactic Acid, Venous: 1.4 mmol/L (ref 0.5–1.9)

## 2024-08-14 MED ORDER — ACETAMINOPHEN 500 MG PO TABS: 1000.0000 mg | ORAL_TABLET | Freq: Four times a day (QID) | ORAL | 2 refills | Status: AC | PRN

## 2024-08-14 MED ORDER — OSELTAMIVIR PHOSPHATE 75 MG PO CAPS: 75.0000 mg | ORAL_CAPSULE | Freq: Two times a day (BID) | ORAL | 0 refills | Status: AC

## 2024-08-14 NOTE — ED Notes (Signed)
 Patient ambulated 58ft with stand by assist due to visual impairment (bil blindness). Patient states he's walking at his baseline and that he feels good. Friend walked with patient and RN and agrees that patient is ambulating at baseline.

## 2024-08-14 NOTE — Discharge Instructions (Addendum)
 Your evaluation in the emergency department was notable for a positive flu test.  I do believe this is your cause of your symptoms, and I prescribed you a medication to help treat this.  I have also prescribed you Tylenol  to use as needed for any fevers.  Please do follow-up with your primary care provider for reevaluation, and return to the emergency department with any new or worsening symptoms.

## 2024-08-19 LAB — CULTURE, BLOOD (ROUTINE X 2)
Culture: NO GROWTH
Culture: NO GROWTH

## 2024-10-02 ENCOUNTER — Ambulatory Visit: Admitting: Urology

## 2024-10-09 ENCOUNTER — Ambulatory Visit: Admitting: Urology
